# Patient Record
Sex: Female | Born: 1970 | Race: White | Hispanic: No | Marital: Single | Smoking: Current every day smoker
Health system: Southern US, Community
[De-identification: ages and names within clinical notes are randomized; demographics above are authoritative.]

## PROBLEM LIST (undated history)

## (undated) DIAGNOSIS — Z8489 Family history of other specified conditions: Secondary | ICD-10-CM

## (undated) DIAGNOSIS — F32A Depression, unspecified: Secondary | ICD-10-CM

## (undated) DIAGNOSIS — F329 Major depressive disorder, single episode, unspecified: Secondary | ICD-10-CM

## (undated) DIAGNOSIS — F988 Other specified behavioral and emotional disorders with onset usually occurring in childhood and adolescence: Secondary | ICD-10-CM

## (undated) DIAGNOSIS — R519 Headache, unspecified: Secondary | ICD-10-CM

## (undated) DIAGNOSIS — M1990 Unspecified osteoarthritis, unspecified site: Secondary | ICD-10-CM

## (undated) DIAGNOSIS — G5602 Carpal tunnel syndrome, left upper limb: Secondary | ICD-10-CM

## (undated) DIAGNOSIS — R51 Headache: Secondary | ICD-10-CM

## (undated) DIAGNOSIS — F419 Anxiety disorder, unspecified: Secondary | ICD-10-CM

## (undated) DIAGNOSIS — K5900 Constipation, unspecified: Secondary | ICD-10-CM

## (undated) DIAGNOSIS — G8929 Other chronic pain: Secondary | ICD-10-CM

## (undated) HISTORY — PX: ANKLE SURGERY: SHX546

## (undated) HISTORY — PX: TUBAL LIGATION: SHX77

## (undated) HISTORY — PX: ABDOMINAL HYSTERECTOMY: SHX81

---

## 2000-08-25 ENCOUNTER — Other Ambulatory Visit: Admission: RE | Admit: 2000-08-25 | Discharge: 2000-08-25 | Payer: Self-pay | Admitting: Obstetrics and Gynecology

## 2001-08-30 ENCOUNTER — Other Ambulatory Visit: Admission: RE | Admit: 2001-08-30 | Discharge: 2001-08-30 | Payer: Self-pay | Admitting: Obstetrics and Gynecology

## 2002-05-04 ENCOUNTER — Other Ambulatory Visit: Admission: RE | Admit: 2002-05-04 | Discharge: 2002-05-04 | Payer: Self-pay | Admitting: Obstetrics and Gynecology

## 2008-03-28 ENCOUNTER — Encounter (INDEPENDENT_AMBULATORY_CARE_PROVIDER_SITE_OTHER): Payer: Self-pay | Admitting: Obstetrics and Gynecology

## 2008-03-28 ENCOUNTER — Ambulatory Visit (HOSPITAL_COMMUNITY): Admission: RE | Admit: 2008-03-28 | Discharge: 2008-03-28 | Payer: Self-pay | Admitting: Obstetrics and Gynecology

## 2009-01-02 ENCOUNTER — Inpatient Hospital Stay (HOSPITAL_COMMUNITY): Admission: RE | Admit: 2009-01-02 | Discharge: 2009-01-03 | Payer: Self-pay | Admitting: Obstetrics and Gynecology

## 2009-01-02 ENCOUNTER — Encounter (INDEPENDENT_AMBULATORY_CARE_PROVIDER_SITE_OTHER): Payer: Self-pay | Admitting: Obstetrics and Gynecology

## 2011-04-06 LAB — COMPREHENSIVE METABOLIC PANEL
ALT: 21 U/L (ref 0–35)
AST: 43 U/L — ABNORMAL HIGH (ref 0–37)
Albumin: 2.9 g/dL — ABNORMAL LOW (ref 3.5–5.2)
Calcium: 8.1 mg/dL — ABNORMAL LOW (ref 8.4–10.5)
Chloride: 107 mEq/L (ref 96–112)
Creatinine, Ser: 0.5 mg/dL (ref 0.4–1.2)
GFR calc Af Amer: >60 mL/min (ref >60)
Sodium: 141 mEq/L (ref 135–145)

## 2011-04-06 LAB — CBC
Hemoglobin: 14.7 g/dL (ref 12.0–15.0)
MCHC: 33.6 g/dL (ref 30.0–36.0)
MCHC: 34.1 g/dL (ref 30.0–36.0)
MCV: 104.1 fL — ABNORMAL HIGH (ref 78.0–100.0)
Platelets: 161 10*3/uL (ref 150–400)
Platelets: 203 10*3/uL (ref 150–400)
RBC: 3.32 MIL/uL — ABNORMAL LOW (ref 3.87–5.11)
RDW: 13.8 % (ref 11.5–15.5)
WBC: 7.9 10*3/uL (ref 4.0–10.5)

## 2011-05-05 NOTE — Op Note (Signed)
[name], Robinson               ACCOUNT NO.:  [id number]   MEDICAL RECORD NO.:  [id number]          PATIENT TYPE:  INP   LOCATION:  9302                          FACILITY:  [facility]   PHYSICIAN:  Carrington Clamp, M.D. DATE OF BIRTH:  10-04-1971   DATE OF PROCEDURE:  DATE OF DISCHARGE:                               OPERATIVE REPORT   PREOPERATIVE DIAGNOSES:  1. Severe menorrhagia, not responsive to medical therapy.  2. Previous attempt at NovaSure ablation that was failed secondary to      uterine perforation.   POSTOPERATIVE DIAGNOSES:  1. Severe menorrhagia, not responsive to medical therapy.  2. Previous attempt at NovaSure ablation that was failed secondary to      uterine perforation.   PROCEDURE:  Laparoscopic-assisted vaginal hysterectomy.   SURGEON:  Carrington Clamp, MD   ASSISTANT:  Randye Lobo, MD   ANESTHESIA:  General.   FINDINGS:  About a 6-week size uterus without any abnormalities.  There  was no endometriosis seen.  Bilateral ovaries were normal.  The ureters  were seen coursing out of the field of dissection before, during, and  after the operation.  The appendix was not identified, but the liver  edge and the gallbladder appeared normal.   SPECIMENS:  Uterus and cervix and Filshie clips.   DISPOSITION:  To pathology.   ESTIMATED BLOOD LOSS:  300 mL.   IV FLUIDS:  2600 mL.   URINE OUTPUT:  Not measured.   COMPLICATIONS:  None.  The bladder had been filled several times with  water and sterile milk in order to ensure that the bladder was intact.   COUNTS:  Correct x3.   TECHNIQUE:  After adequate general anesthesia was achieved, the patient  was prepped and draped in the sterile fashion in dorsal lithotomy  position.  Both arms were tucked, and the hands were made sure that they  were clear of any obstacles.  The bladder was emptied with a red rubber  catheter during the surgery, and a speculum had been placed in the  vagina to allow an uterine  manipulator be passed through into the  cervix.  The speculum was removed, and attention was then turned to the  abdomen.  A 2-cm infraumbilical incision was made with the scalpel.  The  OptiVu trocar was placed, and under direct visualization entered into  the peritoneum.  The abdomen was insufflated, and the anterior  peritoneal wall was clear of any adhesions from prior tubal ligation.  The ureters were identified coursing bilaterally and normal position out  of the projected field of dissection.  The above findings were otherwise  noted, and because the patient wished to retain her ovaries they were  left in situ.   Two 5-mm trocars were placed under direct visualization of the camera  after the abdomen had been insufflated.  The EnSeal instrument was  placed in the abdomen, and a grasper used to grasp the cornea of the  uterus.  The EnSeal instrument was used to seal and ligate the right  round ligament.  The right fallopian tube was also sealed and  ligated.  Dissection of the intervening mesosalpinx was then undertaken.  The  utero-ovarian ligament and artery were then sealed as well and the  Filshie clip had been located on the uterus side of the dissection, but  at this point had fallen off into the pelvis.  The EnSeal instrument  used to take down with several bites the broad ligament until the  descending branch of the uterine artery was identified.  A pair of  scissors was used at this point in time to carefully dissect the  peritoneum off the posterior uterosacral and then carefully dissect the  peritoneum and incise the peritoneum of the vesicouterine reflection.  The same exact procedure was performed on the left-hand side with the  EnSeal instrument.  The ascending branches of the uterine arteries were  able to be sealed with the EnSeal instrument and then ligated.  Dissection of the bladder was begun with the scissors.  It was difficult  to obtain the correct plane, but  careful dissection was undertaken and  stayed deliberately deep towards the cervix.  However, we were unable to  actually secure the bladder reflection all the way off the cervix and  therefore that part of the procedure was stopped.  The bladder was then  filled with about 200 mL of sterile milk and no leakage was noted intra-  abdominally.  Attention was then turned to the vagina where all but 60  mL of sterile milk had been emptied from the bladder and the catheter  removed.  The uterine manipulator was removed off the cervix and  replaced with 2 Lahey clamps.  The cervix was injected with 1% lidocaine  with epi in a circumferential manner.  The scalpel was then used to make  a circumferential incision around the cervix with the reflection of the  vagina onto the cervix.  The posterior cul-de-sac was entered into with  the Mayo scissors, and the duckbill retractor placed.  The dissection  anteriorly was carefully undertaken just a short way up the cervix  initially.  No milk was still on the field during this procedure.  Heaney clamps were then used to clamp the bilateral uterosacrals.  Each  pedicle was incised with the Mayo scissors and secured with a Heaney  stitch of 0 Vicryl.   Continued dissection of the anterior cervix at the vesicouterine fascia  removing the bladder off the cervix until the anterior peritoneum was  entered into was effected.  The cardinal ligament was then secured with  alternating successive bites of the Heaney clamp.  Each pedicle was  incised with the Mayo scissors and secured with a stitch of 0 Vicryl.  Pedicles were identified as being hemostatic.  At this point, there was  a small little amount of bleeding from the vaginal cuff, but that was  going to be taken care of with the cuff stitches.  The posterior cuff  did have some cautery applied to it in order to reduce the flow of  blood.   The anterior peritoneum was identified and the secured with a 2-0  Vicryl  stitch.  This stitch was then placed in a pursestring suture in the  following manner:  Incorporated the right uterosacral through and  through the posterior cul-de-sac into the vagina and back into the  posterior cul-de-sac, modified Halban culdoplasty through and through  the posterior cul-de-sac into the vagina and back through into the  posterior cul-de-sac between the uterosacrals, the left uterosacral and  finally incorporated  the anterior peritoneum.  Once this stitch was tied  down, the peritoneum was closed, and the cuff was then closed with  multiple figure-of-eight stitches of 0 Vicryl.  Hemostasis had been  achieved, and attention was then turned to the abdomen.   Once gloves were changed, the abdomen was insufflated, and the scope  passed into the abdominal cavity again.  A small amount of bleeding was  seen on the vaginal cuff inside the uterosacrals and this was taken care  of with the Kleppinger instrument by bipolar cautery.  The Filshie clip  was successfully removed through one of the 5-mm ports.  All pedicles  were seen to be hemostatic and all instruments were then withdrawn from  the abdomen.  The abdomen was desufflated, and all the trocars removed.  At the 10-mm trocar site, fascia was closed with a figure-of-eight  stitch of 2-0 Vicryl.  At the small 5-mm trocar, skin incisions were  closed with through-and-through stitch of 3-0 Vicryl Rapide.  All  incisions were closed with Dermabond.   Once we went back from the vagina back to ensure hemostasis, the bladder  had been filled again with over 200 mL of milk and saline, and careful  inspection intra-abdominally revealed that the bladder was completely  intact.      Carrington Clamp, M.D.  Electronically Signed     MH/MEDQ  D:  01/02/2009  T:  01/03/2009  Job:  [id number]

## 2011-05-05 NOTE — Op Note (Signed)
[name], Robinson               ACCOUNT NO.:  [id number]   MEDICAL RECORD NO.:  [id number]          PATIENT TYPE:  AMB   LOCATION:  SDC                           FACILITY:  [facility]   PHYSICIAN:  Carrington Clamp, M.D. DATE OF BIRTH:  02-07-1971   DATE OF PROCEDURE:  03/28/2008  DATE OF DISCHARGE:                               OPERATIVE REPORT   PREOPERATIVE DIAGNOSES:  1. Menorrhagia, not responsive to medical management.  2. Desires sterilization.   POSTOPERATIVE DIAGNOSES:  1. Menorrhagia, not responsive to medical management.  2. Desires sterilization.  3. Uterine polyp.   PROCEDURE:  1. Diagnostic laparoscopy.  2. Filshie clip bilateral tubal ligation.  3. Dilation and curettage.  4. Hysteroscopy.   SURGEON:  Carrington Clamp, M.D..   ASSISTANTS:  None.   ANESTHESIA:  General.   FINDINGS:  Normal-size uterus, tubes and ovaries.  There were no  adhesions or endometriosis or any other abnormalities of the pelvis  seen.  There is a 2-cm polyp in the endometrial cavity; this was  removed.  There have been bilateral fundal punctures with the NovaSure  instrument that ceased bleeding with cautery.   SPECIMENS:  To pathology.   ESTIMATED BLOOD LOSS:  100 mL.   IV FLUIDS:   URINE OUTPUT:  Not measured.   COMPLICATIONS:  Bilateral fundal punctures with the NovaSure instrument.  The NovaSure procedure was aborted.  The right-hand side fundal puncture  was cauterized with a Kleppinger to achieve hemostasis.  The left-hand  side puncture had minimal oozing and was left alone.   MEDICATIONS:  None.   COUNTS:  Correct x3.   TECHNIQUE:  After adequate general anesthesia was achieved, the patient  was prepped and draped in the usual sterile fashion in the dorsal  lithotomy position.  A uterine manipulator was placed in the uterus and  the bladder drained with a red rubber catheter during the procedure.  A  2-cm infraumbilical incision was made with a scalpel and the  Veress  needle passed in without aspiration of the bowel contents or blood.  The  abdomen was insufflated and then the 10-mm trocar placed without  complication.  The Filshie clip instruments were introduced into the  abdomen after the above findings were noted.  A pair of Filshie clips  were placed on each on the isthmic portion of the right and left tube.  It was confirmed of the Filshie clips were around the entirety of the  tubes by looking at both sides of the mesosalpinx.  The instruments were  then withdrawn from the abdomen and the abdomen desufflated.  Attention  was then turned to the vagina, in which the uterine manipulator was  removed as well as the catheter.  The cervix was grasped with a single-  tooth tenaculum.  The cervix was then dilated with [facility] dilators.  The  hysteroscope was passed into the uterine cavity and a 2-cm polyp was  located and removed with the polyp forceps.  The curettage was then  performed with a sharp curette and the hysteroscope passed into the  uterine cavity and indicated that  a good curettage had been performed.   The uterine cavity had been measured at 8 and a cervical length of 3.5,  giving a cavity length a 4.5.  The NovaSure instrument was set for this.  The instrument was checked just prior to putting in the body and there  seemed to be no problems.  However, the NovaSure instrument was then  introduced into the endometrial cavity.  The instrument was pulled back  just a little bit to allow deployment and then pushed forward gently to  seat it.  At this point, the cavity width was almost 5 cm.  Two attempts  were made at the CO2 insufflation and both failed.  At this point, the  NovaSure instrument was removed and the hysteroscope passed through and  a clear puncture on the right-hand side was seen with the scope.  The  hysteroscopy deficit was up to 200.  The NovaSure was then abandoned and  all instruments were withdrawn from the vagina,  except for a sponge  stick.  We changed gloves and returned to the abdomen, where the scope  was passed again into the abdominal cavity.  The abdominal cavity was  insufflated.  The left-hand puncture was smaller than the right-hand  puncture.  The left-hand puncture was clearly stable and without  bleeding.  The right-hand puncture was bleeding a little bit and  hemostasis was achieved with a Kleppinger.  The bowel, cul-de-sac and  ovaries otherwise appeared normal.  Irrigation was performed and the  puncture was reinspected and found to be hemostatic.  All instruments  were then withdrawn from the abdominal cavity, the abdomen desufflated.  The 2-cm infraumbilical fascial incision was closed with figure-of-eight  stitch of 2-0 Vicryl.  The skin incision was closed with Dermabond.  The  sponge stick was removed from the vagina.  The patient tolerated the  procedure well and was returned to recovery room in stable condition.  I  am about to go discuss with the patient's family the complication and I  will discuss it with the patient as well.      Carrington Clamp, M.D.  Electronically Signed     MH/MEDQ  D:  03/28/2008  T:  03/28/2008  Job:  [id number]

## 2011-09-15 LAB — URINALYSIS, ROUTINE W REFLEX MICROSCOPIC
Bilirubin Urine: NEGATIVE
Glucose, UA: NEGATIVE
Hgb urine dipstick: NEGATIVE
Ketones, ur: NEGATIVE
Protein, ur: NEGATIVE

## 2011-09-15 LAB — CBC
HCT: 43
Hemoglobin: 14.9
MCHC: 34.6
Platelets: 203
RDW: 14

## 2013-10-03 ENCOUNTER — Emergency Department (HOSPITAL_COMMUNITY)
Admission: EM | Admit: 2013-10-03 | Discharge: 2013-10-03 | Disposition: A | Discharge status: Home / Self Care | Payer: BC Managed Care – PPO | Attending: Emergency Medicine | Admitting: Emergency Medicine

## 2013-10-03 ENCOUNTER — Encounter (HOSPITAL_COMMUNITY): Payer: Self-pay | Admitting: Emergency Medicine

## 2013-10-03 ENCOUNTER — Emergency Department (HOSPITAL_COMMUNITY): Payer: BC Managed Care – PPO

## 2013-10-03 DIAGNOSIS — S82852A Displaced trimalleolar fracture of left lower leg, initial encounter for closed fracture: Secondary | ICD-10-CM

## 2013-10-03 DIAGNOSIS — S82853A Displaced trimalleolar fracture of unspecified lower leg, initial encounter for closed fracture: Secondary | ICD-10-CM | POA: Insufficient documentation

## 2013-10-03 DIAGNOSIS — S9306XA Dislocation of unspecified ankle joint, initial encounter: Secondary | ICD-10-CM | POA: Insufficient documentation

## 2013-10-03 DIAGNOSIS — F17200 Nicotine dependence, unspecified, uncomplicated: Secondary | ICD-10-CM | POA: Insufficient documentation

## 2013-10-03 DIAGNOSIS — Z79899 Other long term (current) drug therapy: Secondary | ICD-10-CM | POA: Insufficient documentation

## 2013-10-03 DIAGNOSIS — X500XXA Overexertion from strenuous movement or load, initial encounter: Secondary | ICD-10-CM | POA: Insufficient documentation

## 2013-10-03 DIAGNOSIS — W108XXA Fall (on) (from) other stairs and steps, initial encounter: Secondary | ICD-10-CM | POA: Insufficient documentation

## 2013-10-03 DIAGNOSIS — Y929 Unspecified place or not applicable: Secondary | ICD-10-CM | POA: Insufficient documentation

## 2013-10-03 DIAGNOSIS — Z8739 Personal history of other diseases of the musculoskeletal system and connective tissue: Secondary | ICD-10-CM | POA: Insufficient documentation

## 2013-10-03 DIAGNOSIS — Y9301 Activity, walking, marching and hiking: Secondary | ICD-10-CM | POA: Insufficient documentation

## 2013-10-03 DIAGNOSIS — F1010 Alcohol abuse, uncomplicated: Secondary | ICD-10-CM | POA: Insufficient documentation

## 2013-10-03 DIAGNOSIS — R269 Unspecified abnormalities of gait and mobility: Secondary | ICD-10-CM | POA: Insufficient documentation

## 2013-10-03 DIAGNOSIS — S9305XA Dislocation of left ankle joint, initial encounter: Secondary | ICD-10-CM

## 2013-10-03 LAB — CBC WITH DIFFERENTIAL/PLATELET
Basophils Absolute: 0 10*3/uL (ref 0.0–0.1)
Eosinophils Absolute: 0 10*3/uL (ref 0.0–0.7)
Eosinophils Relative: 0 % (ref 0–5)
HCT: 41.8 % (ref 36.0–46.0)
Lymphocytes Relative: 34 % (ref 12–46)
MCH: 35.8 pg — ABNORMAL HIGH (ref 26.0–34.0)
MCHC: 34.9 g/dL (ref 30.0–36.0)
MCV: 102.5 fL — ABNORMAL HIGH (ref 78.0–100.0)
Monocytes Absolute: 0.3 10*3/uL (ref 0.1–1.0)
RDW: 13.7 % (ref 11.5–15.5)
WBC: 5.1 10*3/uL (ref 4.0–10.5)

## 2013-10-03 LAB — BASIC METABOLIC PANEL
CO2: 23 mEq/L (ref 19–32)
Calcium: 8 mg/dL — ABNORMAL LOW (ref 8.4–10.5)
Creatinine, Ser: 0.48 mg/dL — ABNORMAL LOW (ref 0.50–1.10)

## 2013-10-03 MED ORDER — HYDROMORPHONE HCL PF 1 MG/ML IJ SOLN
1.0000 mg | Freq: Once | INTRAMUSCULAR | Status: AC
  Administered 2013-10-03: 1 mg via INTRAVENOUS
  Filled 2013-10-03: qty 1

## 2013-10-03 MED ORDER — PROPOFOL 10 MG/ML IV BOLUS
100.0000 mg | Freq: Once | INTRAVENOUS | Status: AC
  Administered 2013-10-03: 100 mg via INTRAVENOUS
  Filled 2013-10-03: qty 20

## 2013-10-03 MED ORDER — NICOTINE 21 MG/24HR TD PT24
21.0000 mg | MEDICATED_PATCH | Freq: Every day | TRANSDERMAL | Status: DC
  Administered 2013-10-03: 21 mg via TRANSDERMAL
  Filled 2013-10-03: qty 1

## 2013-10-03 MED ORDER — ONDANSETRON HCL 4 MG PO TABS: 4.0000 mg | ORAL_TABLET | Freq: Four times a day (QID) | ORAL | Status: DC

## 2013-10-03 MED ORDER — SODIUM CHLORIDE 0.9 % IV BOLUS (SEPSIS)
1000.0000 mL | Freq: Once | INTRAVENOUS | Status: AC
  Administered 2013-10-03: 1000 mL via INTRAVENOUS

## 2013-10-03 MED ORDER — OXYCODONE-ACETAMINOPHEN 5-325 MG PO TABS: 2.0000 | ORAL_TABLET | Freq: Four times a day (QID) | ORAL | Status: DC | PRN

## 2013-10-03 MED ORDER — ONDANSETRON HCL 4 MG/2ML IJ SOLN
4.0000 mg | Freq: Once | INTRAMUSCULAR | Status: AC
  Administered 2013-10-03: 4 mg via INTRAVENOUS
  Filled 2013-10-03: qty 2

## 2013-10-03 NOTE — ED Notes (Signed)
Family at bedside. 

## 2013-10-03 NOTE — ED Notes (Signed)
[facility] from home 1550. Obvious deformity to Left ankle. PT sitting on porch steps, started to stand up, mat that PT was standing on slipped out from under her feet. Unknown if PT fell to head. Hx of prior surgery to Left foot.  EMS Fentanyl given.

## 2013-10-03 NOTE — Progress Notes (Signed)
Orthopedic Tech Progress Note Patient Details:  Tracie Robinson Mar 29, 1971 [id number]  Ortho Devices Type of Ortho Device: Ace wrap;Post (short leg) splint;Stirrup splint Ortho Device/Splint Location: LLE Ortho Device/Splint Interventions: Ordered;Application   Jennye Moccasin 10/03/2013, 7:38 PM

## 2013-10-03 NOTE — ED Provider Notes (Signed)
CSN: [id number]     Arrival date & time 10/03/13  1657 History   First MD Initiated Contact with Patient 10/03/13 1708     Chief Complaint  Patient presents with  . Ankle Injury   (Consider location/radiation/quality/duration/timing/severity/associated sxs/prior Treatment) HPI Comments: Patient is a 42 year old female presents today after falling on a wet wire mat. She injured her left ankle. She does not remember exactly what happened because "it all happened so fast". She has no other new pain from the fall. There is no obvious deformity noted. She was brought in by EMS and given fentanyl which did not relieve her pain. She reports that the pain is a 1000 out of 10. She cannot give a quality of the pain other than it is constant. She struggles with chronic pain issues and is being treated for sciatica in her back. She admits to drinking 4 beers and taking one Xanax today. She has prior fracture to her left ankle approximately 2 years ago. No surgery or hardware in ankle. She believes her tetanus is UTD.   The history is provided by the patient. No language interpreter was used.    History reviewed. No pertinent past medical history. Past Surgical History  Procedure Laterality Date  . Ankle surgery Left    History reviewed. No pertinent family history. History  Substance Use Topics  . Smoking status: Current Every Day Smoker  . Smokeless tobacco: Not on file  . Alcohol Use: Yes   OB History   Grav Para Term Preterm Abortions TAB SAB Ect Mult Living                 Review of Systems  Constitutional: Negative for fever and chills.  Respiratory: Negative for shortness of breath.   Cardiovascular: Negative for chest pain.  Gastrointestinal: Negative for nausea, vomiting and abdominal pain.  Musculoskeletal: Positive for arthralgias, gait problem, joint swelling and myalgias.  All other systems reviewed and are negative.    Allergies  Review of patient's allergies indicates no  known allergies.  Home Medications   Current Outpatient Rx  Name  Route  Sig  Dispense  Refill  . ALPRAZolam (XANAX) 1 MG tablet   Oral   Take 1 mg by mouth 4 (four) times daily as needed for sleep.         . Aspirin-Acetaminophen (GOODYS BODY PAIN PO)   Oral   Take 1 Package by mouth every 6 (six) hours as needed (back pain).         Marland Kitchen FLUoxetine (PROZAC) 40 MG capsule   Oral   Take 40 mg by mouth daily.         . mirtazapine (REMERON) 30 MG tablet   Oral   Take 30 mg by mouth at bedtime as needed.         . ondansetron (ZOFRAN) 4 MG tablet   Oral   Take 1 tablet (4 mg total) by mouth every 6 (six) hours.   12 tablet   0   . oxyCODONE-acetaminophen (PERCOCET/ROXICET) 5-325 MG per tablet   Oral   Take 2 tablets by mouth every 6 (six) hours as needed for pain.   20 tablet   0    BP 114/83  Pulse 86  Temp(Src) 98.2 F (36.8 C)  Resp 16  Ht 5\' 4"  (1.626 m)  Wt 155 lb (70.308 kg)  BMI 26.59 kg/m2  SpO2 90% Physical Exam  Nursing note and vitals reviewed. Constitutional: She is oriented to person,  place, and time. She appears well-developed and well-nourished. No distress.  HENT:  Head: Normocephalic and atraumatic.  Right Ear: External ear normal.  Left Ear: External ear normal.  Nose: Nose normal.  Mouth/Throat: Oropharynx is clear and moist.  Eyes: Conjunctivae are normal.  Neck: Normal range of motion.  Cardiovascular: Normal rate, regular rhythm and normal heart sounds.   Pulmonary/Chest: Effort normal and breath sounds normal. No stridor. No respiratory distress. She has no wheezes. She has no rales.  Abdominal: Soft. She exhibits no distension.  Musculoskeletal:       Left ankle: She exhibits decreased range of motion, swelling and deformity. She exhibits normal pulse. Tenderness.  Obvious deformity of left ankle. Skin tenting medially. Neurovascularly intact.  Distal pulse intact. Cap refill <3 seconds in all toes. Sensation intact in all toes.    Neurological: She is alert and oriented to person, place, and time. She has normal strength.  Skin: Skin is warm and dry. She is not diaphoretic. No erythema.  Psychiatric: She has a normal mood and affect. Her behavior is normal.    ED Course  Reduction of dislocation Performed by: Mora Bellman Authorized by: Mora Bellman Consent: Verbal consent obtained. written consent not obtained. The procedure was performed in an emergent situation. Risks and benefits: risks, benefits and alternatives were discussed Consent given by: patient Patient understanding: patient states understanding of the procedure being performed Patient consent: the patient's understanding of the procedure matches consent given Required items: required blood products, implants, devices, and special equipment available Patient identity confirmed: verbally with patient and arm band Time out: Immediately prior to procedure a "time out" was called to verify the correct patient, procedure, equipment, support staff and site/side marked as required. Patient sedated: yes Sedatives: propofol Analgesia: see MAR for details Patient tolerance: Patient tolerated the procedure well with no immediate complications.  SPLINT APPLICATION Performed by: Mora Bellman Authorized by: Mora Bellman Consent: Verbal consent obtained. written consent not obtained. The procedure was performed in an emergent situation. Risks and benefits: risks, benefits and alternatives were discussed Consent given by: patient Patient understanding: patient states understanding of the procedure being performed Patient consent: the patient's understanding of the procedure matches consent given Required items: required blood products, implants, devices, and special equipment available Patient identity confirmed: verbally with patient and arm band Time out: Immediately prior to procedure a "time out" was called to verify the correct patient,  procedure, equipment, support staff and site/side marked as required. Location details: left ankle Splint type: short leg Supplies used: cotton padding, Ortho-Glass and elastic bandage Post-procedure: The splinted body part was neurovascularly unchanged following the procedure. Patient tolerance: Patient tolerated the procedure well with no immediate complications.   (including critical care time)    Labs Review Labs Reviewed  CBC WITH DIFFERENTIAL - Abnormal; Notable for the following:    MCV 102.5 (*)    MCH 35.8 (*)    All other components within normal limits  BASIC METABOLIC PANEL - Abnormal; Notable for the following:    BUN 4 (*)    Creatinine, Ser 0.48 (*)    Calcium 8.0 (*)    All other components within normal limits   Imaging Review Dg Ankle 2 Views Left  10/03/2013   CLINICAL DATA:  Status post fall. Left ankle pain.  EXAM: LEFT ANKLE - 2 VIEW  COMPARISON:  None.  FINDINGS: The patient has an oblique fracture of the distal fibula with 1 shaft with lateral displacement.  The talus is laterally subluxed and posteriorly subluxed. Medial malleolar fracture is identified. There also appears to be a posterior malleolar fracture.  IMPRESSION: Trimalleolar fractures and lateral and posterior subluxation of the talus out of the tibiotalar joint.   Electronically Signed   By: Drusilla Kanner M.D.   On: 10/03/2013 18:32   Dg Ankle Left Port  10/03/2013   CLINICAL DATA:  Postreduction  EXAM: PORTABLE LEFT ANKLE - 2 VIEW  COMPARISON:  10/03/2013  FINDINGS: Status post reduction of a severely displaced trimalleolar fracture. Cast obscures bone detail. Allowing for this, there is near anatomic position and alignment of the fracture fragments with symmetric mortise, all with the exception of a 5 mm posterior displacement of distal fracture fragment at the oblique fracture site in the distal shaft of the fibula.  IMPRESSION: Significantly improved positioning and alignment status post  reduction.   Electronically Signed   By: Esperanza Heir M.D.   On: 10/03/2013 19:21    EKG Interpretation   None      7:54 PM Discussed case with Dr. Charlann Boxer. He recommends non weight bearing and aggressive ice and elevation. She needs to call the office and see Dr. Victorino Dike this week for probable surgery next week.   MDM   1. Trimalleolar fracture of ankle, closed, left, initial encounter   2. Ankle dislocation, left, initial encounter    Patient presents with trimalleloar fractures and lateral and posterior subluxation of talus out of tibiotalar joint. Successful reduction of ankle. Splinted in short leg splint with stirrup. Neurovascularly intact. Discussed this case with Dr. Charlann Boxer who is on call for orthopedics. He recommends non weight bearing, aggressive ice and elevation, and follow up with Dr. Victorino Dike. I discussed this with patient and her family who expresses understanding. She will call in the am for an appointment this week with likely surgery next week. I also discussed reasons to return to the emergency department sooner. Dr. Jodi Mourning evaluated patient and assisted with reduction. He agrees with plan. Vital signs stable for discharge. Patient / Family / Caregiver informed of clinical course, understand medical decision-making process, and agree with plan.     Mora Bellman, PA-C 10/04/13 [phone]  Medical screening examination/treatment/procedure(s) were conducted as a shared visit with non-physician practitioner(s) or resident and myself. I personally evaluated the patient during the encounter and agree with the findings and plan unless otherwise indicated.  Twisted ankle on porch steps, mild fell. Pt denies other injuries. Left ankle dislocated/ deformed, pain with rom and palpation, decr rom due to deformity, nv intact distal. Pain meds. Xray and procedural sedation to reduce.  Neck supple, neuro intact.      Xray showed trimalleolar unstable fracture.  Mild skin tenting on exam  medially, close fx.   Discussed r/b of sedation, pt consented.  Procedural sedation Performed by: Enid Skeens Consent: Verbal consent obtained. Risks and benefits: risks, benefits and alternatives were discussed Required items: required blood products, implants, devices, and special equipment available Patient identity confirmed: arm band and provided demographic data Time out: Immediately prior to procedure a "time out" was called to verify the correct patient, procedure, equipment, support staff and site  Sedation type: moderate (conscious) sedation NPO time confirmed, risks discussed  Sedatives: propofol 80 mg  Physician Time at Bedside: 20 min  Vitals: Vital signs were monitored during sedation. Cardiac Monitor, pulse oximeter Patient tolerance: Patient tolerated the procedure well with no immediate complications. Comments: Pt with uneventful recovered. Returned to pre-procedural sedation baseline  I personally assisted with ankle relocation/ reduction and splinting with ortho tech.  NV intact after.  Ortho called to arrange fup to discuss surgery outpt.  Pt pain controlled in ED.   Ankle dislocation, Trimall fracture  Enid Skeens, MD 10/04/13 0130

## 2013-10-25 NOTE — ED Provider Notes (Signed)
CSN: [id number]     Arrival date & time 10/03/13  1657 History   First MD Initiated Contact with Patient 10/03/13 1708     Chief Complaint  Patient presents with  . Ankle Injury   (Consider location/radiation/quality/duration/timing/severity/associated sxs/prior Treatment) HPI HPI Comments: Patient is a 42 year old female presents today after falling on a wet wire mat. She injured her left ankle. She does not remember exactly what happened because "it all happened so fast". She has no other new pain from the fall. There is no obvious deformity noted. She was brought in by EMS and given fentanyl which did not relieve her pain. She reports that the pain is a 1000 out of 10. She cannot give a quality of the pain other than it is constant. She struggles with chronic pain issues and is being treated for sciatica in her back. She admits to drinking 4 beers and taking one Xanax today. She has prior fracture to her left ankle approximately 2 years ago. No surgery or hardware in ankle. She believes her tetanus is UTD.   History reviewed. No pertinent past medical history. Past Surgical History  Procedure Laterality Date  . Ankle surgery Left    History reviewed. No pertinent family history. History  Substance Use Topics  . Smoking status: Current Every Day Smoker  . Smokeless tobacco: Not on file  . Alcohol Use: Yes   OB History   Grav Para Term Preterm Abortions TAB SAB Ect Mult Living                 Review of Systems Constitutional: Negative for fever and chills.  Respiratory: Negative for shortness of breath.  Cardiovascular: Negative for chest pain.  Gastrointestinal: Negative for nausea, vomiting and abdominal pain.  Musculoskeletal: Positive for arthralgias, gait problem, joint swelling and myalgias.  All other systems reviewed and are negative.  Allergies  Review of patient's allergies indicates no known allergies.  Home Medications   Current Outpatient Rx  Name  Route  Sig   Dispense  Refill  . ALPRAZolam (XANAX) 1 MG tablet   Oral   Take 1 mg by mouth 4 (four) times daily as needed for sleep.         . Aspirin-Acetaminophen (GOODYS BODY PAIN PO)   Oral   Take 1 Package by mouth every 6 (six) hours as needed (back pain).         Marland Kitchen FLUoxetine (PROZAC) 40 MG capsule   Oral   Take 40 mg by mouth daily.         . mirtazapine (REMERON) 30 MG tablet   Oral   Take 30 mg by mouth at bedtime as needed.         . ondansetron (ZOFRAN) 4 MG tablet   Oral   Take 1 tablet (4 mg total) by mouth every 6 (six) hours.   12 tablet   0   . oxyCODONE-acetaminophen (PERCOCET/ROXICET) 5-325 MG per tablet   Oral   Take 2 tablets by mouth every 6 (six) hours as needed for pain.   20 tablet   0    BP 114/83  Pulse 86  Temp(Src) 98.2 F (36.8 C)  Resp 16  Ht 5\' 4"  (1.626 m)  Wt 155 lb (70.308 kg)  BMI 26.59 kg/m2  SpO2 90% Physical Exam Nursing note and vitals reviewed.  Constitutional: She is oriented to person, place, and time. She appears well-developed and well-nourished. No distress.  HENT:  Head: Normocephalic and atraumatic.  Right Ear: External ear normal.  Left Ear: External ear normal.  Nose: Nose normal.  Mouth/Throat: Oropharynx is clear and moist.  Eyes: Conjunctivae are normal.  Neck: Normal range of motion.  Cardiovascular: Normal rate, regular rhythm and normal heart sounds.  Pulmonary/Chest: Effort normal and breath sounds normal. No stridor. No respiratory distress. She has no wheezes. She has no rales.  Abdominal: Soft. She exhibits no distension.  Musculoskeletal:  Left ankle: She exhibits decreased range of motion, swelling and deformity. She exhibits normal pulse. Tenderness.  Obvious deformity of left ankle. Skin tenting medially. Neurovascularly intact. Distal pulse intact. Cap refill <3 seconds in all toes. Sensation intact in all toes.  Neurological: She is alert and oriented to person, place, and time. She has normal  strength.  Skin: Skin is warm and dry. She is not diaphoretic. No erythema.  Psychiatric: She has a normal mood and affect. Her behavior is normal.   ED Course  Procedures (including critical care time) Reduction of dislocation Performed by: Mora Bellman  Authorized by: Mora Bellman  Consent: Verbal consent obtained. written consent not obtained. The procedure was performed in an emergent situation. Risks and benefits: risks, benefits and alternatives were discussed Consent given by: patient  Patient understanding: patient states understanding of the procedure being performed  Patient consent: the patient's understanding of the procedure matches consent given  Required items: required blood products, implants, devices, and special equipment available  Patient identity confirmed: verbally with patient and arm band  Time out: Immediately prior to procedure a "time out" was called to verify the correct patient, procedure, equipment, support staff and site/side marked as required. Patient sedated: yes Sedatives: propofol Analgesia: see MAR for details Patient tolerance: Patient tolerated the procedure well with no immediate complications.  SPLINT APPLICATION Performed by: Mora Bellman  Authorized by: Mora Bellman  Consent: Verbal consent obtained. written consent not obtained. The procedure was performed in an emergent situation. Risks and benefits: risks, benefits and alternatives were discussed Consent given by: patient  Patient understanding: patient states understanding of the procedure being performed  Patient consent: the patient's understanding of the procedure matches consent given  Required items: required blood products, implants, devices, and special equipment available  Patient identity confirmed: verbally with patient and arm band  Time out: Immediately prior to procedure a "time out" was called to verify the correct patient, procedure, equipment, support  staff and site/side marked as required. Location details: left ankle Splint type: short leg Supplies used: cotton padding, Ortho-Glass and elastic bandage Post-procedure: The splinted body part was neurovascularly unchanged following the procedure. Patient tolerance: Patient tolerated the procedure well with no immediate complications.  (including critical care time)   Labs Review Labs Reviewed  CBC WITH DIFFERENTIAL - Abnormal; Notable for the following:    MCV 102.5 (*)    MCH 35.8 (*)    All other components within normal limits  BASIC METABOLIC PANEL - Abnormal; Notable for the following:    BUN 4 (*)    Creatinine, Ser 0.48 (*)    Calcium 8.0 (*)    All other components within normal limits   Imaging Review No results found.  Dg Ankle 2 Views Left  10/03/2013 CLINICAL DATA: Status post fall. Left ankle pain. EXAM: LEFT ANKLE - 2 VIEW COMPARISON: None. FINDINGS: The patient has an oblique fracture of the distal fibula with 1 shaft with lateral displacement. The talus is laterally subluxed and posteriorly subluxed. Medial malleolar fracture is identified.  There also appears to be a posterior malleolar fracture. IMPRESSION: Trimalleolar fractures and lateral and posterior subluxation of the talus out of the tibiotalar joint. Electronically Signed By: Drusilla Kanner M.D. On: 10/03/2013 18:32  Dg Ankle Left Port  10/03/2013 CLINICAL DATA: Postreduction EXAM: PORTABLE LEFT ANKLE - 2 VIEW COMPARISON: 10/03/2013 FINDINGS: Status post reduction of a severely displaced trimalleolar fracture. Cast obscures bone detail. Allowing for this, there is near anatomic position and alignment of the fracture fragments with symmetric mortise, all with the exception of a 5 mm posterior displacement of distal fracture fragment at the oblique fracture site in the distal shaft of the fibula. IMPRESSION: Significantly improved positioning and alignment status post reduction. Electronically Signed By: Esperanza Heir M.D. On: 10/03/2013 19:21   7:54 PM Discussed case with Dr. Charlann Boxer. He recommends non weight bearing and aggressive ice and elevation. She needs to call the office and see Dr. Victorino Dike this week for probable surgery next week.    EKG Interpretation   None       MDM   1. Trimalleolar fracture of ankle, closed, left, initial encounter   2. Ankle dislocation, left, initial encounter    Patient presents with trimalleloar fractures and lateral and posterior subluxation of talus out of tibiotalar joint. Successful reduction of ankle. Splinted in short leg splint with stirrup. Neurovascularly intact. Discussed this case with Dr. Charlann Boxer who is on call for orthopedics. He recommends non weight bearing, aggressive ice and elevation, and follow up with Dr. Victorino Dike. I discussed this with patient and her family who expresses understanding. She will call in the am for an appointment this week with likely surgery next week. I also discussed reasons to return to the emergency department sooner. Dr. Jodi Mourning evaluated patient and assisted with reduction. He agrees with plan. Vital signs stable for discharge. Patient / Family / Caregiver informed of clinical course, understand medical decision-making process, and agree with plan.  Mora Bellman, PA-C  10/04/13 0048      Mora Bellman, PA-C 10/25/13 1510

## 2013-10-25 NOTE — ED Provider Notes (Signed)
See previous note. I personally evaluated and assisted with reduction and procedural sedation.   Enid Skeens, MD 10/25/13 [phone]

## 2014-08-06 ENCOUNTER — Ambulatory Visit: Payer: Self-pay | Admitting: Podiatry

## 2014-08-09 ENCOUNTER — Ambulatory Visit (INDEPENDENT_AMBULATORY_CARE_PROVIDER_SITE_OTHER): Payer: BC Managed Care – PPO | Admitting: Podiatry

## 2014-08-09 ENCOUNTER — Ambulatory Visit: Payer: Self-pay | Admitting: Podiatry

## 2014-08-09 ENCOUNTER — Ambulatory Visit (INDEPENDENT_AMBULATORY_CARE_PROVIDER_SITE_OTHER): Payer: BC Managed Care – PPO

## 2014-08-09 VITALS — BP 129/91 | HR 83 | Resp 16

## 2014-08-09 DIAGNOSIS — M25579 Pain in unspecified ankle and joints of unspecified foot: Secondary | ICD-10-CM

## 2014-08-09 DIAGNOSIS — Z472 Encounter for removal of internal fixation device: Secondary | ICD-10-CM

## 2014-08-09 DIAGNOSIS — M779 Enthesopathy, unspecified: Secondary | ICD-10-CM

## 2014-08-09 DIAGNOSIS — M25572 Pain in left ankle and joints of left foot: Secondary | ICD-10-CM

## 2014-08-09 MED ORDER — TRIAMCINOLONE ACETONIDE 10 MG/ML IJ SUSP
10.0000 mg | Freq: Once | INTRAMUSCULAR | Status: AC
  Administered 2014-08-09: 10 mg

## 2014-08-09 MED ORDER — HYDROCODONE-ACETAMINOPHEN 10-325 MG PO TABS: 1.0000 | ORAL_TABLET | Freq: Three times a day (TID) | ORAL | Status: DC | PRN

## 2014-08-09 NOTE — Progress Notes (Signed)
Subjective:     Patient ID: Tracie MoriCynthia P Capley, female   DOB: 04/20/1971, 43 y.o.   MRN: [id number]  HPI patient states that she broke her left ankle around 10 months ago and she had surgery on it and it is bothering her on the outside and making it hard to walk comfortably. She did not get along well with the Dr. and the last time she saw him was in January and I had treated her prior for other issues.   Review of Systems  All other systems reviewed and are negative.      Objective:   Physical Exam  Nursing note and vitals reviewed. Constitutional: She is oriented to person, place, and time.  Cardiovascular: Intact distal pulses.   Musculoskeletal: Normal range of motion.  Neurological: She is oriented to person, place, and time.  Skin: Skin is warm.   neurovascular status found to be intact with muscle strength adequate and range of motion subtalar and midtarsal joint within normal limits. Patient is found to have a large plate on the outside of the left ankle that is irritated and especially around the fibular malleolus it is quite prominent and sore. I did not note any mottled skin formation or any coolness to the foot but it is moderately discomforting when palpated and I did note there was discomfort in the sinus tarsi with inversion eversion     Assessment:     Difficult to say as to whether or not the plate is causing all of her problems but there is no doubt that she has irritation from the screws especially around the fibular malleolus teary at no current indications of chronic pain syndrome but she does have some pain in excess to what I would expect 10 months after surgery so that we'll need to be watched and I do think she has sinus tarsi inflammation    Plan:     H&P and x-ray reviewed with Dr. Ardelle AntonWagoner who saw the patient with me. We both agreed that removal of the plate at this time is the most prudent way to go and hopefully will give her relief of all symptoms even though I  spent a great of time explained to her that there is no guarantee that this will solve all the pain she is experiencing. Patient wants to have this procedure done needs to wait several months due to her work schedule and at this time she read a consent form for removal of plate and 8 screws going over all possible complications that can occur and the fact there is no long-term guarantees as far success of this procedure. She is willing to accept this risk signs consent form was given preoperative instructions for surgery and is scheduled to have this done as soon as she is able with work. I encouraged her to call with any other questions and I did inject the sinus tarsi today to try to give her some relief and dispensed an air fracture walker to wear at home to try to reduce inflammation

## 2014-08-09 NOTE — Progress Notes (Signed)
   Subjective:    Patient ID: Tracie MoriCynthia P Colarusso, female    DOB: April 13, 1971, 43 y.o.   MRN: [id number]  HPI Comments: "I need some help with this ankle"  Patient c/o severe aching lateral left ankle since her injury October 2014. She fell and fractured and dislocated her ankle. She was put in temp cast in ER and Dr. Victorino DikeHewitt did surgery. She was in cast post op. She has never felt better since surgery. Can't go for walks and not able to wear sneakers. Her ankle swells if on it a lot. She has a noticeable knot at the lateral ankle. Needs another opinion.      Review of Systems  Constitutional: Positive for activity change and unexpected weight change.  Musculoskeletal: Positive for arthralgias and back pain.  Psychiatric/Behavioral: The patient is nervous/anxious.   All other systems reviewed and are negative.      Objective:   Physical Exam        Assessment & Plan:

## 2014-08-21 ENCOUNTER — Telehealth: Payer: Self-pay | Admitting: *Deleted

## 2014-08-21 NOTE — Telephone Encounter (Signed)
I'm a patient of Dr. Charlsie Merles.  I had a bit of a boo boo earlier.  I just have some questions.  You can reach me at this number or  [phone].

## 2014-08-22 NOTE — Telephone Encounter (Signed)
I attempted to call the patient, I left a message on her home and mobile phone.

## 2014-09-07 ENCOUNTER — Telehealth: Payer: Self-pay | Admitting: *Deleted

## 2014-09-07 NOTE — Telephone Encounter (Signed)
I'm a patient of Dr. Charlsie Merles calling concerning my ankle.  I been trying to call you guys, I called yesterday.  I returned her call.  She stated she is scheduled for surgery on 09/25/2014.  "My question is, is it okay to get another cortisone injection in my ankle this close to surgery or can I get something for pain?  My foot is killing me.  I explained to her that Dr. Charlsie Merles is not in the office. I will try to get in contact with him.  She stated well I know he had another doctor to come in there to talk to me.  Can you ask him?  I told her I would try to contact them.  She said I know I would have to come in there to pick up a prescription because it can't be called in.  Just let me know.

## 2014-09-11 NOTE — Telephone Encounter (Signed)
vicodin 10/325. Need to make sure she has signed the consent form. # [id number]

## 2014-09-13 NOTE — Telephone Encounter (Signed)
I attempted to call the patient.  I left her a message to call me back. 

## 2014-09-14 MED ORDER — HYDROCODONE-ACETAMINOPHEN 10-325 MG PO TABS: 1.0000 | ORAL_TABLET | Freq: Four times a day (QID) | ORAL | Status: DC | PRN

## 2014-09-14 NOTE — Telephone Encounter (Signed)
Calling back, I'm a patient of Dr. Charlsie Merles.  I'm scheduled to have surgery.  Someone had called me twice today.  I'm just returning the call.  I called and informed her that Dr. Charlsie Merles said you can come by to pick up a prescription.  She said her dad may pick it up for her.  I asked for his name and explained to her that he will have to show identification to pick it up as well as sign for it.  She stated, "Things have really gotten strict.  Either he or I will pick it up.  His name is Leitha Schuller.

## 2014-09-25 ENCOUNTER — Telehealth: Payer: Self-pay | Admitting: *Deleted

## 2014-09-25 ENCOUNTER — Encounter: Payer: Self-pay | Admitting: Podiatry

## 2014-09-25 DIAGNOSIS — Z4889 Encounter for other specified surgical aftercare: Secondary | ICD-10-CM

## 2014-09-25 NOTE — Telephone Encounter (Signed)
I had surgery this morning by Dr. Charlsie Merlesegal.  I took the boot off to ice it and my bandage is wet with blood.  It's not on the ace bandage, just on the gauze.  Should I be concerned?  I told her it is normal, if it gets to be excessive give us a call.  If it comes through the ace bandage and gets bigger than a 50 cent piece give us a call.  I also advised her to elevate as much as possible.  She stated, "I have been, only time I am up is to go to the bathroom."

## 2014-09-27 ENCOUNTER — Telehealth: Payer: Self-pay | Admitting: Podiatry

## 2014-09-27 NOTE — Telephone Encounter (Signed)
10.8.2015 I called the patient to reschedule her appointment time for her first post-op visit on Monday 10.12.2015. After rescheduling her appointment time the patient stated that she was in severe pain and had questions about her pain medication. She also stated that she peeled her bandage back and saw dried blood, but that it was damp in the middle where it was unable to dry. I told her that the nurses were currently with patients but as soon as I saw one I would have them call her in regards to her questions about her pain medication. JMS

## 2014-10-01 ENCOUNTER — Ambulatory Visit (INDEPENDENT_AMBULATORY_CARE_PROVIDER_SITE_OTHER): Payer: BC Managed Care – PPO

## 2014-10-01 ENCOUNTER — Ambulatory Visit (INDEPENDENT_AMBULATORY_CARE_PROVIDER_SITE_OTHER): Payer: BC Managed Care – PPO | Admitting: Podiatry

## 2014-10-01 ENCOUNTER — Encounter: Payer: Self-pay | Admitting: Podiatry

## 2014-10-01 VITALS — BP 133/88 | HR 75 | Resp 16

## 2014-10-01 DIAGNOSIS — Z9889 Other specified postprocedural states: Secondary | ICD-10-CM

## 2014-10-01 MED ORDER — OXYCODONE-ACETAMINOPHEN 10-325 MG PO TABS: 1.0000 | ORAL_TABLET | Freq: Three times a day (TID) | ORAL | Status: DC | PRN

## 2014-10-02 NOTE — Progress Notes (Signed)
Subjective:     Patient ID: Tracie MoriCynthia P Stohr, female   DOB: 1971/11/12, 43 y.o.   MRN: [id number]  HPI patient states that she is sore from the surgery but overall is doing fine after having played removed from the left ankle   Review of Systems     Objective:   Physical Exam Neurovascular status intact with well-healing surgical site left lateral ankle with negative Homans sign noted wound edges well coapted and no drainage    Assessment:     Healing well from plate removal left    Plan:     Reviewed x-rays and continued immobilization of the area with boot and nonweightbearing as best as possible. Reappoint 2 weeks for staple removal or earlier if any issues should occur

## 2014-10-15 ENCOUNTER — Other Ambulatory Visit: Payer: BC Managed Care – PPO

## 2014-10-15 ENCOUNTER — Telehealth: Payer: Self-pay | Admitting: *Deleted

## 2014-10-15 MED ORDER — HYDROCODONE-ACETAMINOPHEN 10-325 MG PO TABS: 1.0000 | ORAL_TABLET | Freq: Four times a day (QID) | ORAL | Status: DC | PRN

## 2014-10-15 NOTE — Telephone Encounter (Signed)
Pt cancelled appt due to lack of transportation, but would like a refill of the Hydrocodone.  I explained to the pt the Hydrocodone would need to be picked up in the office.  Pt is rescheduled to 10/18/2014.  Dr. Charlsie Merlesegal ordered to refill the Hydrocodone for enough to get pt in for the appt on 10/18/2014.

## 2014-10-18 ENCOUNTER — Encounter: Payer: Self-pay | Admitting: Podiatry

## 2014-10-18 ENCOUNTER — Ambulatory Visit (INDEPENDENT_AMBULATORY_CARE_PROVIDER_SITE_OTHER): Payer: BC Managed Care – PPO | Admitting: Podiatry

## 2014-10-18 VITALS — BP 129/69 | HR 88 | Resp 16

## 2014-10-18 DIAGNOSIS — Z9889 Other specified postprocedural states: Secondary | ICD-10-CM

## 2014-10-18 MED ORDER — HYDROCODONE-ACETAMINOPHEN 10-325 MG PO TABS: 1.0000 | ORAL_TABLET | Freq: Three times a day (TID) | ORAL | Status: DC | PRN

## 2014-10-22 NOTE — Progress Notes (Signed)
Subjective:     Patient ID: Tracie MoriCynthia P Cowans, female   DOB: Jun 08, 1971, 43 y.o.   MRN: [id number]  HPIpatient states I'm starting to feel better and I'm ready to get my staples out   Review of Systems     Objective:   Physical Exam Neurovascular status intact with well-healing surgical site left lateral leg with staples in place    Assessment:     Doing well post removal of hardware left    Plan:     Complete anesthetic provided to the area prior to removal of staples which then were removed with wound edges well coapted no drainage noted no erythema noted and good alignment noted. Apply dressing and did refill prescription for Percocet as she still has discomfort more towards the end of the day. Reappoint to recheck

## 2014-11-08 ENCOUNTER — Encounter: Payer: BC Managed Care – PPO | Admitting: Podiatry

## 2014-11-12 ENCOUNTER — Encounter: Payer: BC Managed Care – PPO | Admitting: Podiatry

## 2014-11-20 ENCOUNTER — Encounter: Payer: BC Managed Care – PPO | Admitting: Podiatry

## 2014-11-29 ENCOUNTER — Ambulatory Visit (INDEPENDENT_AMBULATORY_CARE_PROVIDER_SITE_OTHER): Payer: BC Managed Care – PPO | Admitting: Podiatry

## 2014-11-29 ENCOUNTER — Encounter: Payer: Self-pay | Admitting: Podiatry

## 2014-11-29 VITALS — BP 133/88 | HR 75 | Resp 16

## 2014-11-29 DIAGNOSIS — M779 Enthesopathy, unspecified: Secondary | ICD-10-CM

## 2014-11-29 DIAGNOSIS — Z9889 Other specified postprocedural states: Secondary | ICD-10-CM

## 2014-11-29 MED ORDER — TRIAMCINOLONE ACETONIDE 10 MG/ML IJ SUSP
10.0000 mg | Freq: Once | INTRAMUSCULAR | Status: AC
  Administered 2014-11-29: 10 mg

## 2014-11-29 MED ORDER — HYDROCODONE-ACETAMINOPHEN 10-325 MG PO TABS
1.0000 | ORAL_TABLET | Freq: Three times a day (TID) | ORAL | Status: DC | PRN
Start: 2014-11-29 — End: 2016-03-19

## 2014-11-29 NOTE — Progress Notes (Signed)
Subjective:     Patient ID: Tracie Robinson, female   DOB: 1971-03-23, 43 y.o.   MRN: [id number]  HPI patient states that her foot is feeling quite a bit better but that she is having pain in her ankle and she doesn't think it'll ever be normal   Review of Systems     Objective:   Physical Exam Neurovascular status is unchanged with well-healed surgical site lateral aspect of left ankle secondary to removal of plate and 8 screws and has discomfort now in the sinus tarsi left and the medial ankle    Assessment:     Doing well post removal of large fixation device left but does have probable inflammation and arthritis secondary to severe fracture in the past    Plan:     Reviewed condition and at this time I did a sub-Taylor injection 3 mg Kenalog 5 mg Xylocaine to try to reduce inflammation and did write a prescription for hydrocodone 10/325 to take on an as-needed basis. Patient will be reevaluated in 8 weeks earlier if necessary

## 2014-12-26 ENCOUNTER — Ambulatory Visit: Payer: BC Managed Care – PPO | Admitting: Podiatry

## 2015-01-30 ENCOUNTER — Encounter: Payer: BC Managed Care – PPO | Admitting: Podiatry

## 2015-02-18 ENCOUNTER — Encounter: Payer: Self-pay | Admitting: Podiatry

## 2015-02-26 ENCOUNTER — Encounter: Payer: Self-pay | Admitting: Podiatry

## 2015-02-28 ENCOUNTER — Encounter: Payer: Self-pay | Admitting: Podiatry

## 2015-03-19 ENCOUNTER — Encounter: Payer: Self-pay | Admitting: Podiatry

## 2015-03-21 ENCOUNTER — Encounter: Payer: Self-pay | Admitting: Podiatry

## 2015-03-25 ENCOUNTER — Encounter: Payer: Self-pay | Admitting: Podiatry

## 2015-03-28 ENCOUNTER — Encounter: Payer: Self-pay | Admitting: Podiatry

## 2015-04-04 ENCOUNTER — Encounter: Payer: Self-pay | Admitting: Podiatry

## 2015-04-30 ENCOUNTER — Encounter: Payer: BLUE CROSS/BLUE SHIELD | Admitting: Podiatry

## 2015-05-03 ENCOUNTER — Ambulatory Visit: Payer: BLUE CROSS/BLUE SHIELD | Admitting: Podiatry

## 2015-05-13 ENCOUNTER — Encounter: Payer: Self-pay | Admitting: Podiatry

## 2015-05-13 ENCOUNTER — Ambulatory Visit (INDEPENDENT_AMBULATORY_CARE_PROVIDER_SITE_OTHER): Payer: BLUE CROSS/BLUE SHIELD | Admitting: Podiatry

## 2015-05-13 ENCOUNTER — Ambulatory Visit (INDEPENDENT_AMBULATORY_CARE_PROVIDER_SITE_OTHER): Payer: BLUE CROSS/BLUE SHIELD

## 2015-05-13 VITALS — BP 121/88 | HR 97 | Resp 18

## 2015-05-13 DIAGNOSIS — R52 Pain, unspecified: Secondary | ICD-10-CM | POA: Diagnosis not present

## 2015-05-13 DIAGNOSIS — S93402A Sprain of unspecified ligament of left ankle, initial encounter: Secondary | ICD-10-CM

## 2015-05-13 DIAGNOSIS — M779 Enthesopathy, unspecified: Secondary | ICD-10-CM | POA: Diagnosis not present

## 2015-05-13 MED ORDER — TRIAMCINOLONE ACETONIDE 10 MG/ML IJ SUSP
10.0000 mg | Freq: Once | INTRAMUSCULAR | Status: AC
  Administered 2015-05-13: 10 mg

## 2015-05-14 NOTE — Progress Notes (Signed)
Subjective:     Patient ID: Tracie Robinson, female   DOB: Apr 01, 1971, 44 y.o.   MRN: [id number]  HPI patient presents stating my left ankle is still sore when pressed and I am just worried I might of broken bone   Review of Systems     Objective:   Physical Exam Neurovascular status intact with history of having trauma to the left ankle with inflammation and fluid especially within the sinus tarsi. States that she is able to bear weight but she still is concerned    Assessment:     Probable sprained ankle with inflammatory changes and cannot rule out other pathology    Plan:     X-rays reviewed with patient at this time I did sinus tarsi injection 3 Milligan Kenalog 5 mg Xylocaine and advised on reduced activity. If symptoms persist reappoint

## 2015-05-21 NOTE — Progress Notes (Signed)
This encounter was created in error - please disregard.

## 2015-09-26 ENCOUNTER — Ambulatory Visit: Payer: BLUE CROSS/BLUE SHIELD | Admitting: Podiatry

## 2015-11-18 ENCOUNTER — Ambulatory Visit: Payer: BLUE CROSS/BLUE SHIELD | Admitting: Podiatry

## 2016-03-10 ENCOUNTER — Telehealth: Payer: Self-pay | Admitting: *Deleted

## 2016-03-10 NOTE — Telephone Encounter (Addendum)
Pt states she has lots of questions.  03/10/2016-Left message encouraging pt to make an appt LOV 04/2015 and she may have a new problem or the previous problem may need new testing and evaluation.  03/20/2016-[employer] PRIOR AUTHORIZATION #[id number], VALID 03/20/2016 TO 04/18/2016.  [facility].

## 2016-03-12 ENCOUNTER — Ambulatory Visit: Payer: BLUE CROSS/BLUE SHIELD | Admitting: Podiatry

## 2016-03-19 ENCOUNTER — Encounter: Payer: Self-pay | Admitting: Podiatry

## 2016-03-19 ENCOUNTER — Ambulatory Visit: Payer: Self-pay

## 2016-03-19 ENCOUNTER — Ambulatory Visit (INDEPENDENT_AMBULATORY_CARE_PROVIDER_SITE_OTHER): Payer: BLUE CROSS/BLUE SHIELD | Admitting: Podiatry

## 2016-03-19 ENCOUNTER — Ambulatory Visit (INDEPENDENT_AMBULATORY_CARE_PROVIDER_SITE_OTHER): Payer: BLUE CROSS/BLUE SHIELD

## 2016-03-19 VITALS — BP 139/96 | HR 88 | Resp 16

## 2016-03-19 DIAGNOSIS — M25572 Pain in left ankle and joints of left foot: Secondary | ICD-10-CM

## 2016-03-19 DIAGNOSIS — T148 Other injury of unspecified body region: Secondary | ICD-10-CM

## 2016-03-19 DIAGNOSIS — M779 Enthesopathy, unspecified: Secondary | ICD-10-CM | POA: Diagnosis not present

## 2016-03-19 DIAGNOSIS — T148XXA Other injury of unspecified body region, initial encounter: Secondary | ICD-10-CM

## 2016-03-19 DIAGNOSIS — R52 Pain, unspecified: Secondary | ICD-10-CM

## 2016-03-19 MED ORDER — GABAPENTIN 300 MG PO CAPS: 300.0000 mg | ORAL_CAPSULE | Freq: Three times a day (TID) | ORAL | Status: AC

## 2016-03-19 MED ORDER — HYDROCODONE-ACETAMINOPHEN 10-325 MG PO TABS: 1.0000 | ORAL_TABLET | Freq: Three times a day (TID) | ORAL | Status: DC | PRN

## 2016-03-20 NOTE — Progress Notes (Signed)
Subjective:     Patient ID: Ilda MoriCynthia P Uber, female   DOB: 18-Mar-1971, 45 y.o.   MRN: [id number]  HPI patient states she's getting a lot of pain in her left ankle and in the lateral side of the joint in the area of the peroneal tendon group. We had removed her plate which did relieve some of her more proximal pain but this pain has been chronic and has been unrelenting in its nature. She states at times her ankle gives away completely is had ankle sprains   Review of Systems     Objective:   Physical Exam Neurovascular status found to be intact muscle strength was adequate with mild excessive inversion left ankle with quite a bit of discomfort in the peroneal tendon group as it comes behind the lateral malleolus and under the lateral malleolus. There is mild to moderate fusiform swelling of this area and it is very tender when palpated    Assessment:     Stability for split tear of the peroneal group left or lateral ankle instability with possibility for ligamentous injury of a chronic nature secondary to previous fracture of the fibula    Plan:     H&P and x-rays reviewed with patient. Due to the chronic nature of this condition and continued discomfort despite utilization of plate removal and previous procedures MRI will be ordered in order to make sure there is not a split tear of peroneal tendon or damage to the lateral ankle area. Will be seen back when they are completed  Multiple views left indicated that there is mild arthritis lateral ankle no indications of diastases injury. We did place her on gabapentin 300 mg try to reduce the burning she is experiencing and hydrocodone for nighttime usage to try to help her sleep and reduce pain

## 2016-03-21 ENCOUNTER — Other Ambulatory Visit: Payer: Self-pay

## 2016-03-28 ENCOUNTER — Other Ambulatory Visit: Payer: Self-pay

## 2016-03-30 ENCOUNTER — Other Ambulatory Visit: Payer: Self-pay

## 2016-04-13 ENCOUNTER — Ambulatory Visit: Payer: BLUE CROSS/BLUE SHIELD | Admitting: Podiatry

## 2016-07-08 ENCOUNTER — Ambulatory Visit: Payer: BLUE CROSS/BLUE SHIELD | Admitting: Podiatry

## 2016-07-15 ENCOUNTER — Encounter: Payer: Self-pay | Admitting: Podiatry

## 2016-07-15 ENCOUNTER — Ambulatory Visit (INDEPENDENT_AMBULATORY_CARE_PROVIDER_SITE_OTHER): Payer: BLUE CROSS/BLUE SHIELD

## 2016-07-15 ENCOUNTER — Ambulatory Visit (INDEPENDENT_AMBULATORY_CARE_PROVIDER_SITE_OTHER): Payer: BLUE CROSS/BLUE SHIELD | Admitting: Podiatry

## 2016-07-15 VITALS — BP 109/77 | HR 97 | Resp 16

## 2016-07-15 DIAGNOSIS — M779 Enthesopathy, unspecified: Secondary | ICD-10-CM

## 2016-07-15 DIAGNOSIS — M79671 Pain in right foot: Secondary | ICD-10-CM | POA: Diagnosis not present

## 2016-07-15 DIAGNOSIS — L03119 Cellulitis of unspecified part of limb: Secondary | ICD-10-CM | POA: Diagnosis not present

## 2016-07-15 DIAGNOSIS — L02619 Cutaneous abscess of unspecified foot: Secondary | ICD-10-CM | POA: Diagnosis not present

## 2016-07-15 MED ORDER — TRIAMCINOLONE ACETONIDE 10 MG/ML IJ SUSP
10.0000 mg | Freq: Once | INTRAMUSCULAR | Status: AC
  Administered 2016-07-15: 10 mg

## 2016-07-15 MED ORDER — HYDROCODONE-ACETAMINOPHEN 10-325 MG PO TABS: 1.0000 | ORAL_TABLET | Freq: Three times a day (TID) | ORAL | 0 refills | Status: DC | PRN

## 2016-07-15 MED ORDER — CEPHALEXIN 500 MG PO CAPS: 500.0000 mg | ORAL_CAPSULE | Freq: Three times a day (TID) | ORAL | 1 refills | Status: DC

## 2016-07-15 NOTE — Progress Notes (Signed)
Subjective:     Patient ID: Tracie Robinson, female   DOB: 10/12/1971, 45 y.o.   MRN: [id number]  HPI patient presents with pain in the left ankle which is reoccurred and concerned that she may have a infection of her right forefoot with several crusted lesions and she did work around my needles and stated she had several spots   Review of Systems  All other systems reviewed and are negative.      Objective:   Physical Exam  Constitutional: She is oriented to person, place, and time.  Cardiovascular: Intact distal pulses.   Musculoskeletal: Normal range of motion.  Neurological: She is oriented to person, place, and time.  Skin: Skin is warm and dry.  Nursing note and vitals reviewed.  neurovascular status intact muscle strength adequate range of motion within normal limits with patient noted to have several crusted lesions dorsal right but no active drainage no erythema no edema or swelling noted. 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 For the left I injected the sinus tarsi 3 mg Kenalog 5 mill grams Xylocaine and advised on reduced activity

## 2016-10-02 ENCOUNTER — Other Ambulatory Visit: Payer: Self-pay | Admitting: Podiatry

## 2016-12-17 ENCOUNTER — Ambulatory Visit: Payer: BLUE CROSS/BLUE SHIELD | Admitting: Podiatry

## 2017-02-04 ENCOUNTER — Encounter: Payer: Self-pay | Admitting: Physician Assistant

## 2017-02-11 ENCOUNTER — Telehealth: Payer: Self-pay | Admitting: *Deleted

## 2017-02-11 ENCOUNTER — Ambulatory Visit: Payer: Self-pay | Admitting: Physician Assistant

## 2017-02-11 NOTE — Telephone Encounter (Addendum)
Pt states she fell a few nights ago and has an appt tomorrow 11:15am, right foot needs and injection, but can't raise her arm and she's bruise all down the side and under her breast. I told pt, if she couldn't lift her arm and was bruised and in that much pain she should go to her PCP, orthopedic doctor or ER, Dr. Charlsie Merlesegal would not give pain medications for all those area. Pt states understanding and she is also hoping Dr. Charlsie Merlesegal will give her a shot in the right foot. I told pt he very well may, but she needs to get the other areas taken care of as well. Pt then asked if Dr. Charlsie Merlesegal would be on call tonight and I told her no, that another doctor would be and our office would not give narcotic pain medication after hours. 02/15/2017-Pt states she has hurt both ankles and her shoulder and arm and would like Dr. Charlsie Merlesegal to see her and to refer for the other injuries. 02/23/2017-Pt states has a shattered upper arm, and is to have surgery, but would like Dr. Beverlee Nimsegal's referral to an orthopedic doctor. 02/24/2017-Unable to leave message from Dr. Charlsie Merlesegal suggesting the [facility], [address] [place], [place] [address], [phone].

## 2017-02-12 ENCOUNTER — Ambulatory Visit: Payer: BLUE CROSS/BLUE SHIELD | Admitting: Podiatry

## 2017-02-17 ENCOUNTER — Ambulatory Visit: Payer: BLUE CROSS/BLUE SHIELD | Admitting: Podiatry

## 2017-02-21 ENCOUNTER — Emergency Department: Payer: BLUE CROSS/BLUE SHIELD

## 2017-02-21 ENCOUNTER — Encounter: Payer: Self-pay | Admitting: Emergency Medicine

## 2017-02-21 ENCOUNTER — Emergency Department
Admission: EM | Admit: 2017-02-21 | Discharge: 2017-02-21 | Disposition: A | Discharge status: Home / Self Care | Payer: BLUE CROSS/BLUE SHIELD | Attending: Emergency Medicine | Admitting: Emergency Medicine

## 2017-02-21 DIAGNOSIS — Y939 Activity, unspecified: Secondary | ICD-10-CM | POA: Insufficient documentation

## 2017-02-21 DIAGNOSIS — S42211A Unspecified displaced fracture of surgical neck of right humerus, initial encounter for closed fracture: Secondary | ICD-10-CM | POA: Insufficient documentation

## 2017-02-21 DIAGNOSIS — F17210 Nicotine dependence, cigarettes, uncomplicated: Secondary | ICD-10-CM | POA: Insufficient documentation

## 2017-02-21 DIAGNOSIS — S42291A Other displaced fracture of upper end of right humerus, initial encounter for closed fracture: Secondary | ICD-10-CM | POA: Diagnosis not present

## 2017-02-21 DIAGNOSIS — M25572 Pain in left ankle and joints of left foot: Secondary | ICD-10-CM | POA: Insufficient documentation

## 2017-02-21 DIAGNOSIS — M25571 Pain in right ankle and joints of right foot: Secondary | ICD-10-CM | POA: Diagnosis not present

## 2017-02-21 DIAGNOSIS — Z79899 Other long term (current) drug therapy: Secondary | ICD-10-CM | POA: Insufficient documentation

## 2017-02-21 DIAGNOSIS — S6991XA Unspecified injury of right wrist, hand and finger(s), initial encounter: Secondary | ICD-10-CM | POA: Diagnosis present

## 2017-02-21 DIAGNOSIS — Y929 Unspecified place or not applicable: Secondary | ICD-10-CM | POA: Diagnosis not present

## 2017-02-21 DIAGNOSIS — W1839XA Other fall on same level, initial encounter: Secondary | ICD-10-CM | POA: Insufficient documentation

## 2017-02-21 DIAGNOSIS — Y999 Unspecified external cause status: Secondary | ICD-10-CM | POA: Insufficient documentation

## 2017-02-21 DIAGNOSIS — T07XXXA Unspecified multiple injuries, initial encounter: Secondary | ICD-10-CM

## 2017-02-21 DIAGNOSIS — T148XXA Other injury of unspecified body region, initial encounter: Secondary | ICD-10-CM | POA: Insufficient documentation

## 2017-02-21 DIAGNOSIS — G8929 Other chronic pain: Secondary | ICD-10-CM | POA: Insufficient documentation

## 2017-02-21 MED ORDER — HYDROCODONE-ACETAMINOPHEN 5-325 MG PO TABS
2.0000 | ORAL_TABLET | Freq: Once | ORAL | Status: AC
  Administered 2017-02-21: 2 via ORAL

## 2017-02-21 MED ORDER — HYDROCODONE-ACETAMINOPHEN 5-325 MG PO TABS: 1.0000 | ORAL_TABLET | ORAL | 0 refills | Status: DC | PRN

## 2017-02-21 MED ORDER — DOCUSATE SODIUM 100 MG PO CAPS: ORAL_CAPSULE | ORAL | 0 refills | Status: AC

## 2017-02-21 MED ORDER — HYDROCODONE-ACETAMINOPHEN 5-325 MG PO TABS: 2.0000 | ORAL_TABLET | Freq: Once | ORAL | Status: DC

## 2017-02-21 MED ORDER — HYDROCODONE-ACETAMINOPHEN 5-325 MG PO TABS
ORAL_TABLET | ORAL | Status: AC
  Administered 2017-02-21: 2 via ORAL
  Filled 2017-02-21: qty 2

## 2017-02-21 NOTE — Discharge Instructions (Signed)
As we discussed, you have a rather severe fracture of her upper right arm.  Please use the sling we provided to take the weight off of your arm and shoulder and call the office of Dr. Joice LoftsPoggi at the number listed as soon as possible on Monday morning to set up the next available appointment.  You may need surgery to correct this fracture and it is important that he follow up with him or one of his colleagues at the next available opportunity.   Take Norco as prescribed for severe pain. Do not drink alcohol, drive or participate in any other potentially dangerous activities while taking this medication as it may make you sleepy. Do not take this medication with any other sedating medications, either prescription or over-the-counter. If you were prescribed Percocet or Vicodin, do not take these with acetaminophen (Tylenol) as it is already contained within these medications.   This medication is an opiate (or narcotic) pain medication and can be habit forming.  Use it as little as possible to achieve adequate pain control.  Do not use or use it with extreme caution if you have a history of opiate abuse or dependence.  If you are on a pain contract with your primary care doctor or a pain specialist, be sure to let them know you were prescribed this medication today from the [facility].  This medication is intended for your use only - do not give any to anyone else and keep it in a secure place where nobody else, especially children, have access to it.  It will also cause or worsen constipation, so you may want to consider taking an over-the-counter stool softener while you are taking this medication.  You may wish to follow up with your foot doctor as well but please do address the issue with your arm on Monday.  Given that you have had multiple recent falls we encourage you to be as cautious and careful as possible, do not mix your medications with any alcohol or anxiety medicine, and  try to receive assistance from family members or friends as much as possible to limit your risk of additional falls.

## 2017-02-21 NOTE — ED Triage Notes (Signed)
Pt to rm 4 via EMS from home.  EMS report pt has had multiple falls due to left ankle weakness, chronic.  EMS report pt hit right arm on table during fall, bruising noted to right chest, swelling noted to right hand, radial pulse strong, cap refill brisk.  EMS report possible deformity felt when humerous palpated, intense pain upon palpation.  No obvious deformity upon visualization.  VSS en route.

## 2017-02-21 NOTE — ED Notes (Signed)
ED Provider at bedside. 
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
## 2017-02-24 ENCOUNTER — Encounter (HOSPITAL_COMMUNITY): Payer: Self-pay | Admitting: *Deleted

## 2017-02-24 NOTE — Progress Notes (Signed)
Spoke with pt for pre-op call. Pt denies cardiac history, chest pain or sob. Pt denies being a diabetic. Instructed pt to not smoke 24 hours prior to surgery.

## 2017-02-24 NOTE — Telephone Encounter (Signed)
I'm not sure who would be best. [facility]

## 2017-02-25 ENCOUNTER — Encounter (HOSPITAL_COMMUNITY)
Admission: RE | Disposition: A | Discharge status: Home / Self Care | Payer: Self-pay | Source: Ambulatory Visit | Attending: Orthopedic Surgery

## 2017-02-25 ENCOUNTER — Observation Stay (HOSPITAL_COMMUNITY)
Admission: RE | Admit: 2017-02-25 | Discharge: 2017-02-26 | Disposition: A | Discharge status: Home / Self Care | Payer: BLUE CROSS/BLUE SHIELD | Source: Ambulatory Visit | Attending: Orthopedic Surgery | Admitting: Orthopedic Surgery

## 2017-02-25 ENCOUNTER — Ambulatory Visit (HOSPITAL_COMMUNITY): Payer: BLUE CROSS/BLUE SHIELD

## 2017-02-25 ENCOUNTER — Ambulatory Visit (HOSPITAL_COMMUNITY): Payer: BLUE CROSS/BLUE SHIELD | Admitting: Anesthesiology

## 2017-02-25 ENCOUNTER — Encounter (HOSPITAL_COMMUNITY): Payer: Self-pay

## 2017-02-25 DIAGNOSIS — G5602 Carpal tunnel syndrome, left upper limb: Secondary | ICD-10-CM | POA: Insufficient documentation

## 2017-02-25 DIAGNOSIS — Z90710 Acquired absence of both cervix and uterus: Secondary | ICD-10-CM | POA: Diagnosis not present

## 2017-02-25 DIAGNOSIS — Z8249 Family history of ischemic heart disease and other diseases of the circulatory system: Secondary | ICD-10-CM | POA: Diagnosis not present

## 2017-02-25 DIAGNOSIS — F988 Other specified behavioral and emotional disorders with onset usually occurring in childhood and adolescence: Secondary | ICD-10-CM | POA: Insufficient documentation

## 2017-02-25 DIAGNOSIS — K5900 Constipation, unspecified: Secondary | ICD-10-CM | POA: Insufficient documentation

## 2017-02-25 DIAGNOSIS — Z8781 Personal history of (healed) traumatic fracture: Secondary | ICD-10-CM | POA: Diagnosis present

## 2017-02-25 DIAGNOSIS — F329 Major depressive disorder, single episode, unspecified: Secondary | ICD-10-CM | POA: Diagnosis not present

## 2017-02-25 DIAGNOSIS — Z7982 Long term (current) use of aspirin: Secondary | ICD-10-CM | POA: Diagnosis not present

## 2017-02-25 DIAGNOSIS — G8929 Other chronic pain: Secondary | ICD-10-CM | POA: Insufficient documentation

## 2017-02-25 DIAGNOSIS — Y939 Activity, unspecified: Secondary | ICD-10-CM | POA: Diagnosis not present

## 2017-02-25 DIAGNOSIS — W19XXXA Unspecified fall, initial encounter: Secondary | ICD-10-CM | POA: Diagnosis not present

## 2017-02-25 DIAGNOSIS — S42201A Unspecified fracture of upper end of right humerus, initial encounter for closed fracture: Secondary | ICD-10-CM | POA: Diagnosis not present

## 2017-02-25 DIAGNOSIS — F17210 Nicotine dependence, cigarettes, uncomplicated: Secondary | ICD-10-CM | POA: Insufficient documentation

## 2017-02-25 DIAGNOSIS — Z79899 Other long term (current) drug therapy: Secondary | ICD-10-CM | POA: Insufficient documentation

## 2017-02-25 DIAGNOSIS — Z419 Encounter for procedure for purposes other than remedying health state, unspecified: Secondary | ICD-10-CM

## 2017-02-25 DIAGNOSIS — F419 Anxiety disorder, unspecified: Secondary | ICD-10-CM | POA: Insufficient documentation

## 2017-02-25 DIAGNOSIS — S42291S Other displaced fracture of upper end of right humerus, sequela: Secondary | ICD-10-CM

## 2017-02-25 DIAGNOSIS — Z967 Presence of other bone and tendon implants: Secondary | ICD-10-CM | POA: Diagnosis present

## 2017-02-25 DIAGNOSIS — M1990 Unspecified osteoarthritis, unspecified site: Secondary | ICD-10-CM | POA: Diagnosis not present

## 2017-02-25 DIAGNOSIS — R51 Headache: Secondary | ICD-10-CM | POA: Insufficient documentation

## 2017-02-25 DIAGNOSIS — Z9889 Other specified postprocedural states: Secondary | ICD-10-CM

## 2017-02-25 HISTORY — DX: Constipation, unspecified: K59.00

## 2017-02-25 HISTORY — DX: Carpal tunnel syndrome, left upper limb: G56.02

## 2017-02-25 HISTORY — DX: Other specified behavioral and emotional disorders with onset usually occurring in childhood and adolescence: F98.8

## 2017-02-25 HISTORY — DX: Headache: R51

## 2017-02-25 HISTORY — DX: Family history of other specified conditions: Z84.89

## 2017-02-25 HISTORY — DX: Major depressive disorder, single episode, unspecified: F32.9

## 2017-02-25 HISTORY — DX: Anxiety disorder, unspecified: F41.9

## 2017-02-25 HISTORY — DX: Headache, unspecified: R51.9

## 2017-02-25 HISTORY — DX: Other chronic pain: G89.29

## 2017-02-25 HISTORY — DX: Depression, unspecified: F32.A

## 2017-02-25 HISTORY — PX: ORIF HUMERUS FRACTURE: SHX2126

## 2017-02-25 HISTORY — DX: Unspecified osteoarthritis, unspecified site: M19.90

## 2017-02-25 LAB — CBC
HCT: 36.7 % (ref 36.0–46.0)
Hemoglobin: 11.7 g/dL — ABNORMAL LOW (ref 12.0–15.0)
MCH: 33.8 pg (ref 26.0–34.0)
MCHC: 31.9 g/dL (ref 30.0–36.0)
MCV: 106.1 fL — AB (ref 78.0–100.0)
Platelets: 172 10*3/uL (ref 150–400)
RBC: 3.46 MIL/uL — ABNORMAL LOW (ref 3.87–5.11)
RDW: 16.2 % — ABNORMAL HIGH (ref 11.5–15.5)
WBC: 5.9 10*3/uL (ref 4.0–10.5)

## 2017-02-25 SURGERY — OPEN REDUCTION INTERNAL FIXATION (ORIF) PROXIMAL HUMERUS FRACTURE
Anesthesia: General | Laterality: Right

## 2017-02-25 MED ORDER — ONDANSETRON HCL 4 MG PO TABS: 4.0000 mg | ORAL_TABLET | Freq: Four times a day (QID) | ORAL | Status: DC | PRN

## 2017-02-25 MED ORDER — LIDOCAINE HCL (CARDIAC) 20 MG/ML IV SOLN
INTRAVENOUS | Status: DC | PRN
  Administered 2017-02-25: 60 mg via INTRAVENOUS

## 2017-02-25 MED ORDER — LIDOCAINE 2% (20 MG/ML) 5 ML SYRINGE
INTRAMUSCULAR | Status: AC
  Filled 2017-02-25: qty 5

## 2017-02-25 MED ORDER — LACTATED RINGERS IV SOLN
INTRAVENOUS | Status: DC
  Administered 2017-02-25: 14:00:00 via INTRAVENOUS

## 2017-02-25 MED ORDER — METOCLOPRAMIDE HCL 5 MG/ML IJ SOLN: 5.0000 mg | Freq: Three times a day (TID) | INTRAMUSCULAR | Status: DC | PRN

## 2017-02-25 MED ORDER — HYDROMORPHONE HCL 2 MG/ML IJ SOLN
1.0000 mg | INTRAMUSCULAR | Status: DC | PRN
  Administered 2017-02-26: 1 mg via INTRAVENOUS
  Filled 2017-02-25: qty 1

## 2017-02-25 MED ORDER — FENTANYL CITRATE (PF) 100 MCG/2ML IJ SOLN
50.0000 ug | INTRAMUSCULAR | Status: DC | PRN
  Administered 2017-02-25: 50 ug via INTRAVENOUS

## 2017-02-25 MED ORDER — PROPOFOL 10 MG/ML IV BOLUS
INTRAVENOUS | Status: DC | PRN
  Administered 2017-02-25: 160 mg via INTRAVENOUS

## 2017-02-25 MED ORDER — METOCLOPRAMIDE HCL 5 MG PO TABS: 5.0000 mg | ORAL_TABLET | Freq: Three times a day (TID) | ORAL | Status: DC | PRN

## 2017-02-25 MED ORDER — ALPRAZOLAM 0.5 MG PO TABS
1.0000 mg | ORAL_TABLET | Freq: Four times a day (QID) | ORAL | Status: DC | PRN
  Administered 2017-02-26: 1 mg via ORAL
  Filled 2017-02-25 (×2): qty 2

## 2017-02-25 MED ORDER — ACETAMINOPHEN 325 MG PO TABS: 650.0000 mg | ORAL_TABLET | Freq: Four times a day (QID) | ORAL | Status: DC | PRN

## 2017-02-25 MED ORDER — ACETAMINOPHEN 650 MG RE SUPP: 650.0000 mg | Freq: Four times a day (QID) | RECTAL | Status: DC | PRN

## 2017-02-25 MED ORDER — FENTANYL CITRATE (PF) 100 MCG/2ML IJ SOLN
INTRAMUSCULAR | Status: AC
  Administered 2017-02-25: 50 ug via INTRAVENOUS
  Filled 2017-02-25: qty 2

## 2017-02-25 MED ORDER — LACTATED RINGERS IV SOLN: INTRAVENOUS | Status: DC

## 2017-02-25 MED ORDER — MIDAZOLAM HCL 2 MG/2ML IJ SOLN
INTRAMUSCULAR | Status: AC
  Filled 2017-02-25: qty 2

## 2017-02-25 MED ORDER — PROMETHAZINE HCL 25 MG/ML IJ SOLN: 6.2500 mg | INTRAMUSCULAR | Status: DC | PRN

## 2017-02-25 MED ORDER — DEXTROSE 5 % IV SOLN
500.0000 mg | Freq: Four times a day (QID) | INTRAVENOUS | Status: DC | PRN
  Filled 2017-02-25: qty 5

## 2017-02-25 MED ORDER — DOCUSATE SODIUM 100 MG PO CAPS
100.0000 mg | ORAL_CAPSULE | Freq: Two times a day (BID) | ORAL | Status: DC
  Administered 2017-02-25 – 2017-02-26 (×2): 100 mg via ORAL
  Filled 2017-02-25 (×2): qty 1

## 2017-02-25 MED ORDER — ASPIRIN EC 325 MG PO TBEC
325.0000 mg | DELAYED_RELEASE_TABLET | Freq: Every day | ORAL | Status: DC
  Administered 2017-02-25 – 2017-02-26 (×2): 325 mg via ORAL
  Filled 2017-02-25 (×2): qty 1

## 2017-02-25 MED ORDER — POLYETHYLENE GLYCOL 3350 17 G PO PACK: 17.0000 g | PACK | Freq: Every day | ORAL | Status: DC | PRN

## 2017-02-25 MED ORDER — PHENOL 1.4 % MT LIQD: 1.0000 | OROMUCOSAL | Status: DC | PRN

## 2017-02-25 MED ORDER — BISACODYL 5 MG PO TBEC: 5.0000 mg | DELAYED_RELEASE_TABLET | Freq: Every day | ORAL | Status: DC | PRN

## 2017-02-25 MED ORDER — VECURONIUM BROMIDE 10 MG IV SOLR
INTRAVENOUS | Status: DC | PRN
  Administered 2017-02-25: 1 mg via INTRAVENOUS
  Administered 2017-02-25: 7 mg via INTRAVENOUS
  Administered 2017-02-25: 1 mg via INTRAVENOUS

## 2017-02-25 MED ORDER — PHENYLEPHRINE HCL 10 MG/ML IJ SOLN
INTRAVENOUS | Status: DC | PRN
  Administered 2017-02-25: 50 ug/min via INTRAVENOUS

## 2017-02-25 MED ORDER — MEPERIDINE HCL 25 MG/ML IJ SOLN: 6.2500 mg | INTRAMUSCULAR | Status: DC | PRN

## 2017-02-25 MED ORDER — MIDAZOLAM HCL 2 MG/2ML IJ SOLN
1.0000 mg | INTRAMUSCULAR | Status: DC | PRN
  Administered 2017-02-25: 2 mg via INTRAVENOUS

## 2017-02-25 MED ORDER — FENTANYL CITRATE (PF) 100 MCG/2ML IJ SOLN: 25.0000 ug | INTRAMUSCULAR | Status: DC | PRN

## 2017-02-25 MED ORDER — MIDAZOLAM HCL 5 MG/5ML IJ SOLN
INTRAMUSCULAR | Status: DC | PRN
Start: 2017-02-25 — End: 2017-02-25
  Administered 2017-02-25 (×2): 2 mg via INTRAVENOUS

## 2017-02-25 MED ORDER — MENTHOL 3 MG MT LOZG: 1.0000 | LOZENGE | OROMUCOSAL | Status: DC | PRN

## 2017-02-25 MED ORDER — FENTANYL CITRATE (PF) 100 MCG/2ML IJ SOLN
INTRAMUSCULAR | Status: DC | PRN
  Administered 2017-02-25: 100 ug via INTRAVENOUS

## 2017-02-25 MED ORDER — DIPHENHYDRAMINE HCL 12.5 MG/5ML PO ELIX: 12.5000 mg | ORAL_SOLUTION | ORAL | Status: DC | PRN

## 2017-02-25 MED ORDER — ONDANSETRON HCL 4 MG/2ML IJ SOLN: 4.0000 mg | Freq: Four times a day (QID) | INTRAMUSCULAR | Status: DC | PRN

## 2017-02-25 MED ORDER — DESVENLAFAXINE SUCCINATE ER 50 MG PO TB24
50.0000 mg | ORAL_TABLET | Freq: Every morning | ORAL | Status: DC
  Administered 2017-02-26: 50 mg via ORAL
  Filled 2017-02-25: qty 1

## 2017-02-25 MED ORDER — MIDAZOLAM HCL 2 MG/2ML IJ SOLN
INTRAMUSCULAR | Status: AC
  Administered 2017-02-25: 2 mg via INTRAVENOUS
  Filled 2017-02-25: qty 2

## 2017-02-25 MED ORDER — SUGAMMADEX SODIUM 200 MG/2ML IV SOLN
INTRAVENOUS | Status: DC | PRN
  Administered 2017-02-25: 200 mg via INTRAVENOUS

## 2017-02-25 MED ORDER — CEFAZOLIN SODIUM-DEXTROSE 2-4 GM/100ML-% IV SOLN
INTRAVENOUS | Status: AC
  Filled 2017-02-25: qty 100

## 2017-02-25 MED ORDER — CEFAZOLIN SODIUM-DEXTROSE 2-4 GM/100ML-% IV SOLN
2.0000 g | INTRAVENOUS | Status: AC
  Administered 2017-02-25: 2 g via INTRAVENOUS

## 2017-02-25 MED ORDER — LACTATED RINGERS IV SOLN
INTRAVENOUS | Status: DC | PRN
  Administered 2017-02-25 (×2): via INTRAVENOUS

## 2017-02-25 MED ORDER — METHOCARBAMOL 500 MG PO TABS
500.0000 mg | ORAL_TABLET | Freq: Four times a day (QID) | ORAL | Status: DC | PRN
  Administered 2017-02-26: 500 mg via ORAL
  Filled 2017-02-25 (×2): qty 1

## 2017-02-25 MED ORDER — PROPOFOL 10 MG/ML IV BOLUS
INTRAVENOUS | Status: AC
  Filled 2017-02-25: qty 20

## 2017-02-25 MED ORDER — MAGNESIUM CITRATE PO SOLN: 1.0000 | Freq: Once | ORAL | Status: DC | PRN

## 2017-02-25 MED ORDER — FLUOXETINE HCL 20 MG PO CAPS
40.0000 mg | ORAL_CAPSULE | Freq: Every day | ORAL | Status: DC
  Administered 2017-02-26: 40 mg via ORAL
  Filled 2017-02-25: qty 2

## 2017-02-25 MED ORDER — GABAPENTIN 300 MG PO CAPS
300.0000 mg | ORAL_CAPSULE | Freq: Three times a day (TID) | ORAL | Status: DC
  Administered 2017-02-25 – 2017-02-26 (×2): 300 mg via ORAL
  Filled 2017-02-25 (×2): qty 1

## 2017-02-25 MED ORDER — HYDROMORPHONE HCL 2 MG PO TABS
2.0000 mg | ORAL_TABLET | ORAL | Status: DC | PRN
  Administered 2017-02-25 – 2017-02-26 (×3): 4 mg via ORAL
  Filled 2017-02-25: qty 2
  Filled 2017-02-25 (×2): qty 1
  Filled 2017-02-25: qty 2

## 2017-02-25 MED ORDER — CEFAZOLIN SODIUM-DEXTROSE 2-4 GM/100ML-% IV SOLN
2.0000 g | Freq: Four times a day (QID) | INTRAVENOUS | Status: AC
  Administered 2017-02-25 – 2017-02-26 (×3): 2 g via INTRAVENOUS
  Filled 2017-02-25 (×3): qty 100

## 2017-02-25 MED ORDER — CHLORHEXIDINE GLUCONATE 4 % EX LIQD: 60.0000 mL | Freq: Once | CUTANEOUS | Status: DC

## 2017-02-25 MED ORDER — BUPIVACAINE-EPINEPHRINE (PF) 0.5% -1:200000 IJ SOLN
INTRAMUSCULAR | Status: DC | PRN
  Administered 2017-02-25: 15 mL via PERINEURAL

## 2017-02-25 MED ORDER — PHENYLEPHRINE HCL 10 MG/ML IJ SOLN
INTRAMUSCULAR | Status: DC | PRN
  Administered 2017-02-25 (×3): 80 ug via INTRAVENOUS

## 2017-02-25 MED ORDER — 0.9 % SODIUM CHLORIDE (POUR BTL) OPTIME
TOPICAL | Status: DC | PRN
  Administered 2017-02-25: 1000 mL

## 2017-02-25 MED ORDER — FENTANYL CITRATE (PF) 100 MCG/2ML IJ SOLN
INTRAMUSCULAR | Status: AC
  Filled 2017-02-25: qty 2

## 2017-02-25 MED ORDER — AMPHETAMINE-DEXTROAMPHET ER 10 MG PO CP24
20.0000 mg | ORAL_CAPSULE | Freq: Every morning | ORAL | Status: DC
  Administered 2017-02-26: 20 mg via ORAL
  Filled 2017-02-25: qty 2

## 2017-02-25 SURGICAL SUPPLY — 70 items
ADH SKN CLS APL DERMABOND .7 (GAUZE/BANDAGES/DRESSINGS) ×1
AID PSTN UNV HD RSTRNT DISP (MISCELLANEOUS) ×1
BIT DRILL 3.2 (BIT) ×3
BIT DRILL 3.2XCALB NS DISP (BIT) IMPLANT
BIT DRILL CALIBRATED 2.7 (BIT) ×1 IMPLANT
BIT DRILL CALIBRATED 2.7MM (BIT) ×1
BIT DRL 3.2XCALB NS DISP (BIT) ×1
COVER SURGICAL LIGHT HANDLE (MISCELLANEOUS) ×3 IMPLANT
DERMABOND ADVANCED (GAUZE/BANDAGES/DRESSINGS) ×2
DERMABOND ADVANCED .7 DNX12 (GAUZE/BANDAGES/DRESSINGS) IMPLANT
DRAPE C-ARM 42X72 X-RAY (DRAPES) ×3 IMPLANT
DRAPE INCISE IOBAN 66X45 STRL (DRAPES) ×3 IMPLANT
DRAPE ORTHO SPLIT 77X108 STRL (DRAPES) ×6
DRAPE SURG ORHT 6 SPLT 77X108 (DRAPES) ×2 IMPLANT
DRAPE U-SHAPE 47X51 STRL (DRAPES) ×3 IMPLANT
DRSG AQUACEL AG ADV 3.5X10 (GAUZE/BANDAGES/DRESSINGS) ×3 IMPLANT
DRSG MEPILEX BORDER 4X8 (GAUZE/BANDAGES/DRESSINGS) ×3 IMPLANT
DURAPREP 26ML APPLICATOR (WOUND CARE) ×3 IMPLANT
ELECT REM PT RETURN 9FT ADLT (ELECTROSURGICAL) ×3
ELECTRODE REM PT RTRN 9FT ADLT (ELECTROSURGICAL) ×1 IMPLANT
GLOVE BIO SURGEON STRL SZ7.5 (GLOVE) ×3 IMPLANT
GLOVE BIO SURGEON STRL SZ8 (GLOVE) ×3 IMPLANT
GLOVE EUDERMIC 7 POWDERFREE (GLOVE) ×6 IMPLANT
GLOVE SS BIOGEL STRL SZ 7.5 (GLOVE) ×2 IMPLANT
GLOVE SUPERSENSE BIOGEL SZ 7.5 (GLOVE) ×4
GOWN STRL REUS W/ TWL LRG LVL3 (GOWN DISPOSABLE) ×1 IMPLANT
GOWN STRL REUS W/ TWL XL LVL3 (GOWN DISPOSABLE) ×2 IMPLANT
GOWN STRL REUS W/TWL LRG LVL3 (GOWN DISPOSABLE) ×3
GOWN STRL REUS W/TWL XL LVL3 (GOWN DISPOSABLE) ×6
K-WIRE 2X5 SS THRDED S3 (WIRE) ×3
KIT BASIN OR (CUSTOM PROCEDURE TRAY) ×3 IMPLANT
KIT ROOM TURNOVER OR (KITS) ×6 IMPLANT
KWIRE 2X5 SS THRDED S3 (WIRE) IMPLANT
MANIFOLD NEPTUNE II (INSTRUMENTS) ×3 IMPLANT
NDL 1/2 CIR CATGUT .05X1.09 (NEEDLE) IMPLANT
NDL SUT .5 MAYO 1.404X.05X (NEEDLE) IMPLANT
NEEDLE 1/2 CIR CATGUT .05X1.09 (NEEDLE) ×3 IMPLANT
NEEDLE 22X1 1/2 (OR ONLY) (NEEDLE) ×3 IMPLANT
NEEDLE MAYO TAPER (NEEDLE)
NS IRRIG 1000ML POUR BTL (IV SOLUTION) ×3 IMPLANT
PACK SHOULDER (CUSTOM PROCEDURE TRAY) ×3 IMPLANT
PAD ARMBOARD 7.5X6 YLW CONV (MISCELLANEOUS) ×6 IMPLANT
PEG LOCKING 3.2X32 (Peg) ×4 IMPLANT
PEG LOCKING 3.2X34 (Screw) ×6 IMPLANT
PEG LOCKING 3.2X38 (Screw) ×2 IMPLANT
PEG LOCKING 3.2X48 (Peg) ×2 IMPLANT
PEG LOCKING 3.2X50 (Screw) ×2 IMPLANT
PLATE PROX HUM HI R 3H 80 (Plate) ×2 IMPLANT
RESTRAINT HEAD UNIVERSAL NS (MISCELLANEOUS) ×3 IMPLANT
SCREW T15 MD 3.5X24MM NS (Screw) ×2 IMPLANT
SCREW T15 MD 3.5X26MM NS (Screw) ×4 IMPLANT
SLEEVE MEASURING 3.2 (BIT) ×2 IMPLANT
SLING ARM IMMOBILIZER LRG (SOFTGOODS) ×2 IMPLANT
SLING ULTRA II LARGE (SOFTGOODS) ×3 IMPLANT
SPONGE LAP 18X18 X RAY DECT (DISPOSABLE) ×3 IMPLANT
SUCTION FRAZIER HANDLE 10FR (MISCELLANEOUS) ×2
SUCTION TUBE FRAZIER 10FR DISP (MISCELLANEOUS) ×1 IMPLANT
SUT FIBERWIRE #2 38 REV NDL BL (SUTURE) ×3
SUT FIBERWIRE #2 38 T-5 BLUE (SUTURE)
SUT MNCRL AB 3-0 PS2 18 (SUTURE) ×3 IMPLANT
SUT VIC AB 1 CT1 27 (SUTURE) ×3
SUT VIC AB 1 CT1 27XBRD ANTBC (SUTURE) ×1 IMPLANT
SUT VIC AB 2-0 CT1 27 (SUTURE) ×6
SUT VIC AB 2-0 CT1 TAPERPNT 27 (SUTURE) ×2 IMPLANT
SUTURE FIBERWR #2 38 T-5 BLUE (SUTURE) IMPLANT
SUTURE FIBERWR#2 38 REV NDL BL (SUTURE) IMPLANT
SYR CONTROL 10ML LL (SYRINGE) ×3 IMPLANT
TOWEL OR 17X24 6PK STRL BLUE (TOWEL DISPOSABLE) ×3 IMPLANT
TOWEL OR 17X26 10 PK STRL BLUE (TOWEL DISPOSABLE) ×3 IMPLANT
WATER STERILE IRR 1000ML POUR (IV SOLUTION) ×3 IMPLANT

## 2017-02-25 NOTE — Anesthesia Procedure Notes (Signed)
Procedure Name: Intubation Date/Time: 02/25/2017 4:04 PM Performed by: Manus Gunning, Icholas Irby J Pre-anesthesia Checklist: Patient identified, Emergency Drugs available, Suction available, Patient being monitored and Timeout performed Patient Re-evaluated:Patient Re-evaluated prior to inductionOxygen Delivery Method: Circle system utilized Preoxygenation: Pre-oxygenation with 100% oxygen Intubation Type: IV induction Ventilation: Mask ventilation without difficulty Laryngoscope Size: Mac and 3 Grade View: Grade I Tube type: Oral Tube size: 7.0 mm Number of attempts: 1 Placement Confirmation: ETT inserted through vocal cords under direct vision,  positive ETCO2 and breath sounds checked- equal and bilateral Secured at: 21 cm Tube secured with: Tape Dental Injury: Teeth and Oropharynx as per pre-operative assessment

## 2017-02-25 NOTE — Op Note (Deleted)
  The note originally documented on this encounter has been moved the the encounter in which it belongs.  

## 2017-02-25 NOTE — Op Note (Signed)
[name]:  Dowlen, Shaquia               ACCOUNT NO.:  [id number]  MEDICAL RECORD NO.:  [id number]  LOCATION:                               FACILITY:  [facility]  PHYSICIAN:  Vania ReaKevin M. Atleigh Gruen, M.D.  DATE OF BIRTH:  12-04-1971  DATE OF PROCEDURE:  02/25/2017 DATE OF DISCHARGE:                              OPERATIVE REPORT   PREOPERATIVE DIAGNOSIS:  Displaced right 3-part proximal humerus fracture.  POSTOPERATIVE DIAGNOSIS:  Displaced right 3-part proximal humerus fracture.  PROCEDURE:  Open reduction and internal fixation of displaced right 3- part proximal humerus fracture.  SURGEON:  Vania ReaKevin M. Anaia Frith, M.D.  Threasa HeadsASSISTANFrench Ana:  Tracy A. Shuford, .A-C  ANESTHESIA:  General endotracheal as well as an interscalene block.  ESTIMATED BLOOD LOSS:  300 mL.  DRAINS:  None.  HISTORY:  Ms. Tracie Robinson is a 46 year old female, who had a series of falls over the last week injuring her right shoulder with continued complaints of pain.  Ultimately sought care at a local emergency room where x-rays showed evidence of severely displaced 3-part proximal humerus fracture. Subsequently placed in a sling.  Followed up in our office yesterday where we re-evaluated her right upper extremity, which showed that she was grossly neurovascularly intact, but had diffuse swelling and tenderness about the shoulder, swelling into the forearm, wrist, and hand, but again grossly neurovascularly intact.  Radiographs again showed severe displacement of the humeral head with a 3-part fracture pattern.  She is brought to the operating room at this time for planned open reduction and internal fixation.  Preoperatively, I counseled Ms. Tracie HardingEllis regarding treatment options, potential risks versus benefits thereof.  Possible surgical complications were all reviewed including bleeding, infection, neurovascular injury, malunion, nonunion, loss of fixation, anesthetic complication, failure of fixation, and possible need for  additional surgery.  She understands and accepts and agrees to planned procedure.  PROCEDURE IN DETAIL:  After undergoing routine preop evaluation, the patient received prophylactic antibiotics.  An interscalene block was established in the holding area by the Anesthesia Department.  Placed supine on the operating table, underwent smooth induction of a general endotracheal anesthesia.  Placed in a beach chair position and appropriately padded and protected.  The right shoulder girdle region was sterilely prepped and draped in a standard fashion.  After we confirmed, fluoroscopically we visualized the shoulder properly.  Time- out was called.  An anterior deltopectoral approach to the right shoulder made through an 8-cm anterior incision.  Skin flaps were elevated.  Dissection carried deeply.  Deltopectoral interval was then developed, vein taken laterally.  At this point, we then identified the fracture site and there was significant early callus formation developing, so clearly this fracture was subacute.  Significant dissection and release of callus and adhesions were required to mobilize the fracture site and using fluoroscopic imaging we ultimately achieved what we felt to be an acceptable reduction with alignment of the shaft to the head on AP and axillary views.  We provisionally placed a 3-hole high Biomet proximal humeral plate and held this with a bone clamp, used fluoroscopic imaging to confirm.  We were happy with the overall alignment.  Placed a guidepin up into the center-center position of the  head and then fastened the plate to the shaft provisionally and then used a series of pegs to fasten the plate to the head and complete fixation of the plate to the shaft with the overall position of construct to our satisfaction.  In addition, there was a displaced posterior fragment of the greater tuberosity and this thin fragment we grasped using a series of #2 FiberWire grasping  sutures through the bone- tendon junction and these were then used to mobilize this fragment and obtain reduction and these suture limbs were then passed through the plate.  At the completion, we were pleased with the overall position and alignment.  Final irrigation was completed.  Hemostasis was obtained. The deltopectoral interval was then reapproximated with a series of figure-of-eight #1 Vicryl sutures.  2-0 Vicryl was used for the subcu layer, intracuticular 3-0 Monocryl for the skin followed by Dermabond, Aquacel dressing.  The right arm was placed in a sling.  The patient was awakened, extubated, taken to the recovery room in stable condition.  Ralene Bathe, PA-C, was used as an Geophysicist/field seismologist throughout this case, was essential for help with positioning the patient, positioning extremity, management of the tissue retractors, manipulation of the fracture, maintenance of reduction, wound closure, and intraoperative decision making.     Vania Rea. Ezella Kell, M.D.   ______________________________ Vania Rea. Elynn Patteson, M.D.    KMS/MEDQ  D:  02/25/2017  T:  02/25/2017  Job:  [id number]

## 2017-02-25 NOTE — Op Note (Signed)
02/25/2017  5:38 PM  PATIENT:   Tracie Robinson  46 y.o. female  PRE-OPERATIVE DIAGNOSIS:  right displaced 3 part  proximal humerus fracture  POST-OPERATIVE DIAGNOSIS:  same  PROCEDURE:  ORIF  SURGEON:  Dynisha Due, Vania ReaKevin M. M.D.  ASSISTANTS: Shuford pac   ANESTHESIA:   GET + ISB  EBL: 300  SPECIMEN:  none  Drains: none   PATIENT DISPOSITION:  PACU - hemodynamically stable.    PLAN OF CARE: Admit for overnight observation  Dictation# [id number]   Contact # [phone]

## 2017-02-25 NOTE — Discharge Instructions (Signed)
° °  Tracie ReaKevin M. Supple, M.D., F.A.A.O.S. Orthopaedic Surgery Specializing in Arthroscopic and Reconstructive Surgery of the Shoulder and Knee [phone] [address] [place], [place] [id number] - Fax [phone]   POST-OP  SHOULDER  INSTRUCTIONS  1. Call the office at [phone] to schedule your first post-op appointment 10-14 days from the date of your surgery.  2. The bandage over your incision is waterproof. You may begin showering with this dressing on. You may leave this dressing on until first follow up appointment within 2 weeks. We prefer you leave this dressing in place until follow up however after 5-7 days if you are having itching or skin irritation and would like to remove it you may do so. Go slow and tug at the borders gently to break the bond the dressing has with the skin. At this point if there is no drainage it is okay to go without a bandage or you may cover it with a light guaze and tape. You can also expect significant bruising around your shoulder that will drift down your arm and into your chest wall. This is very normal and should resolve over several days.   3. Wear your sling/immobilizer at all times except to perform the exercises below or to occasionally let your arm dangle by your side to stretch your elbow. You also need to sleep in your sling immobilizer until instructed otherwise.  4. Range of motion to your elbow, wrist, and hand are encouraged 3-5 times daily. Exercise to your hand and fingers helps to reduce swelling you may experience.  5. Utilize ice to the shoulder 3-5 times minimum a day and additionally if you are experiencing pain.  6. Prescriptions for a pain medication and a muscle relaxant are provided for you. It is recommended that if you are experiencing pain that you pain medication alone is not controlling, add the muscle relaxant along with the pain medication which can give additional pain relief. The first 1-2 days is generally  the most severe of your pain and then should gradually decrease. As your pain lessens it is recommended that you decrease your use of the pain medications to an "as needed basis'" only and to always comply with the recommended dosages of the pain medications.  7. Pain medications can produce constipation along with their use. If you experience this, the use of an over the counter stool softener or laxative daily is recommended.    8. For additional questions or concerns, please do not hesitate to call the office. If after hours there is an answering service to forward your concerns to the physician on call.  POST-OP EXERCISES  Move elbow wrist and hand for edema control

## 2017-02-25 NOTE — Anesthesia Postprocedure Evaluation (Signed)
Anesthesia Post Note  Patient: Ilda MoriCynthia P Mandile  Procedure(s) Performed: Procedure(s) (LRB): OPEN REDUCTION INTERNAL FIXATION (ORIF) PROXIMAL HUMERUS FRACTURE (Right)  Patient location during evaluation: PACU Anesthesia Type: General Level of consciousness: sedated Pain management: pain level controlled Vital Signs Assessment: post-procedure vital signs reviewed and stable Respiratory status: spontaneous breathing and respiratory function stable Cardiovascular status: stable Anesthetic complications: no       Last Vitals:  Vitals:   02/25/17 1926 02/25/17 1930  BP: 110/67   Pulse: 68 70  Resp: 18 19  Temp:      Last Pain:  Vitals:   02/25/17 1940  TempSrc:   PainSc: Asleep                 Harlem Bula DANIEL

## 2017-02-25 NOTE — H&P (Signed)
Tracie Robinson    Chief Complaint: right disIlda Moriplaced proximal humerus fracture HPI: The patient is a 46 y.o. female with a severely displaced right proximal humerus fracture  Past Medical History:  Diagnosis Date  . ADD (attention deficit disorder)   . Anxiety   . Arthritis    left ankle and leg  . Carpal tunnel syndrome of left wrist   . Chronic pain   . Constipation   . Depression   . Family history of adverse reaction to anesthesia    mother has n/v  . Headache     Past Surgical History:  Procedure Laterality Date  . ABDOMINAL HYSTERECTOMY     partial  . ANKLE SURGERY Left    numerous surgeries  . TUBAL LIGATION      Family History  Problem Relation Age of Onset  . CAD Mother   . Peripheral Artery Disease Mother   . Hypertension Father   . High Cholesterol Father     Social History:  reports that she has been smoking Cigarettes.  She has been smoking about 0.50 packs per day. She has never used smokeless tobacco. She reports that she drinks alcohol. She reports that she does not use drugs.   Medications Prior to Admission  Medication Sig Dispense Refill  . ADDERALL XR 20 MG 24 hr capsule Take 20 mg by mouth every morning.  0  . ALPRAZolam (XANAX) 1 MG tablet Take 1 mg by mouth 4 (four) times daily as needed for anxiety or sleep.     . Aspirin-Acetaminophen (GOODYS BODY PAIN PO) Take 1 Package by mouth every 4 (four) hours as needed (back pain).     Marland Kitchen. desvenlafaxine (PRISTIQ) 50 MG 24 hr tablet Take 50 mg by mouth every morning.  5  . docusate sodium (COLACE) 100 MG capsule Take 1 tablet once or twice daily as needed for constipation while taking narcotic pain medicine (Patient taking differently: Take 200 mg by mouth 2 (two) times daily. ) 30 capsule 0  . FLUoxetine (PROZAC) 40 MG capsule Take 40 mg by mouth daily.    Marland Kitchen. gabapentin (NEURONTIN) 300 MG capsule Take 1 capsule (300 mg total) by mouth 3 (three) times daily. 90 capsule 3  . HYDROcodone-acetaminophen  (NORCO/VICODIN) 5-325 MG tablet Take 1-2 tablets by mouth every 4 (four) hours as needed for moderate pain. 30 tablet 0  . tiZANidine (ZANAFLEX) 4 MG tablet Take 4 mg by mouth every 4 (four) hours as needed for muscle spasms.   5  . cephALEXin (KEFLEX) 500 MG capsule TAKE 1 CAPSULE (500 MG TOTAL) BY MOUTH 3 (THREE) TIMES DAILY. (Patient not taking: Reported on 02/23/2017) 30 capsule 1     Physical Exam: right shoulder with diffuse tenderness and swelling, exam as documented in the office yesterday.  Vitals  Temp:  [98.4 F (36.9 C)] 98.4 F (36.9 C) (03/08 1356) Pulse Rate:  [80-84] 84 (03/08 1430) Resp:  [12-20] 15 (03/08 1430) BP: (111-137)/(75-97) 111/77 (03/08 1430) SpO2:  [96 %-100 %] 100 % (03/08 1430) Weight:  [59 kg (130 lb)] 59 kg (130 lb) (03/08 1350)  Assessment/Plan  Impression: right displaced proximal humerus fracture  Plan of Action: Procedure(s): OPEN REDUCTION INTERNAL FIXATION (ORIF) PROXIMAL HUMERUS FRACTURE  Tracie Robinson M Tracie Robinson 02/25/2017, 3:10 PM Contact # [phone]

## 2017-02-25 NOTE — Anesthesia Preprocedure Evaluation (Signed)
Anesthesia Evaluation  Patient identified by MRN, date of birth, ID band Patient awake    Reviewed: Allergy & Precautions, NPO status , Patient's Chart, lab work & pertinent test results  Airway Mallampati: I       Dental   Pulmonary Current Smoker,    breath sounds clear to auscultation       Cardiovascular negative cardio ROS   Rhythm:Regular Rate:Normal     Neuro/Psych  Headaches, PSYCHIATRIC DISORDERS Anxiety Depression  Neuromuscular disease    GI/Hepatic negative GI ROS, Neg liver ROS,   Endo/Other  negative endocrine ROS  Renal/GU negative Renal ROS  negative genitourinary   Musculoskeletal  (+) Arthritis , Osteoarthritis,    Abdominal   Peds negative pediatric ROS (+)  Hematology negative hematology ROS (+)   Anesthesia Other Findings   Reproductive/Obstetrics negative OB ROS                             Anesthesia Physical Anesthesia Plan  ASA: II  Anesthesia Plan: General   Post-op Pain Management: GA combined w/ Regional for post-op pain   Induction: Intravenous  Airway Management Planned: Oral ETT  Additional Equipment:   Intra-op Plan:   Post-operative Plan: Extubation in OR  Informed Consent: I have reviewed the patients History and Physical, chart, labs and discussed the procedure including the risks, benefits and alternatives for the proposed anesthesia with the patient or authorized representative who has indicated his/her understanding and acceptance.   Dental advisory given  Plan Discussed with: CRNA  Anesthesia Plan Comments:         Anesthesia Quick Evaluation

## 2017-02-25 NOTE — Anesthesia Procedure Notes (Signed)
Anesthesia Regional Block: Interscalene brachial plexus block   Pre-Anesthetic Checklist: ,, timeout performed, Correct Patient, Correct Site, Correct Laterality, Correct Procedure, Correct Position, site marked, Risks and benefits discussed,  Surgical consent,  Pre-op evaluation,  At surgeon's request and post-op pain management  Laterality: Right  Prep: chloraprep       Needles:  Injection technique: Single-shot  Needle Type: Echogenic Needle     Needle Length: 9cm  Needle Gauge: 21     Additional Needles:   Procedures: ultrasound guided,,,,,,,,  Narrative:  Start time: 02/25/2017 2:20 PM End time: 02/25/2017 2:25 PM Injection made incrementally with aspirations every 5 mL.  Performed by: Personally  Anesthesiologist: Tracie Robinson, Tracie Robinson  Additional Notes: Tolerated well.

## 2017-02-25 NOTE — Transfer of Care (Signed)
Immediate Anesthesia Transfer of Care Note  Patient: Tracie MoriCynthia P Moren  Procedure(s) Performed: Procedure(s): OPEN REDUCTION INTERNAL FIXATION (ORIF) PROXIMAL HUMERUS FRACTURE (Right)  Patient Location: PACU  Anesthesia Type:GA combined with regional for post-op pain  Level of Consciousness: awake, oriented and patient cooperative  Airway & Oxygen Therapy: Patient Spontanous Breathing and Patient connected to nasal cannula oxygen  Post-op Assessment: Report given to RN, Post -op Vital signs reviewed and stable and Patient moving all extremities  Post vital signs: Reviewed and stable  Last Vitals:  Vitals:   02/25/17 1425 02/25/17 1430  BP: 131/86 111/77  Pulse: 81 84  Resp: 12 15  Temp:      Last Pain:  Vitals:   02/25/17 1356  TempSrc: Oral  PainSc:       Patients Stated Pain Goal: 4 (02/25/17 1345)  Complications: No apparent anesthesia complications

## 2017-02-26 ENCOUNTER — Encounter (HOSPITAL_COMMUNITY): Payer: Self-pay | Admitting: Orthopedic Surgery

## 2017-02-26 DIAGNOSIS — S42201A Unspecified fracture of upper end of right humerus, initial encounter for closed fracture: Secondary | ICD-10-CM | POA: Diagnosis not present

## 2017-02-26 MED ORDER — HYDROMORPHONE HCL 2 MG PO TABS: 2.0000 mg | ORAL_TABLET | ORAL | 0 refills | Status: AC | PRN

## 2017-02-26 MED ORDER — ONDANSETRON HCL 4 MG PO TABS: 4.0000 mg | ORAL_TABLET | Freq: Three times a day (TID) | ORAL | 0 refills | Status: AC | PRN

## 2017-02-26 NOTE — Evaluation (Signed)
Occupational Therapy Evaluation Patient Details Name: CASSIDY TASHIRO MRN: [id number] DOB: April 16, 1971 Today's Date: 02/26/2017    History of Present Illness (P)  S/P ORIF (open reduction internal fixation) fracture proximal humerus fx sustained during a fall. PMH includes, but not limited to, arthritis in L ankle and leg, numerous L ankle surgeries.   Clinical Impression   Pt admitted with the above diagnoses and presents with below problem list. Pt will benefit from continued acute OT to address the below listed deficits and maximize independence with basic ADLs prior to d/c home. PTA pt was independent with ADLs. Does endorse h/o falls. Pt is currently mostly min A for UB ADLs, close min guard for functional mobility. Pt anxious, somewhat impulsive and decreased safety awareness at times. Of note, pt opening personal bag in room and pulling out pill bottles. Verbalized plan to take another Xanax. Therapist told pt not to take any medications on her own while in the hospital and educated on risk of doing so. Nursing notified.       Follow Up Recommendations  No OT follow up;Supervision - Intermittent;Other (comment) (OOB/mobility)    Equipment Recommendations  None recommended by OT    Recommendations for Other Services PT consult     Precautions / Restrictions Precautions Precautions: Fall;Shoulder Type of Shoulder Precautions: passive protocol Shoulder Interventions: Shoulder sling/immobilizer;At all times;Off for dressing/bathing/exercises Precaution Booklet Issued: Yes (comment) Precaution Comments: h/o falls. pt reports L ankle "just gives out with no warning" Required Braces or Orthoses: Sling Restrictions Weight Bearing Restrictions: Yes RUE Weight Bearing: Non weight bearing      Mobility Bed Mobility Overal bed mobility: Needs Assistance Bed Mobility: Supine to Sit;Sit to Supine     Supine to sit: Min guard Sit to supine: Min guard      Transfers Overall  transfer level: Needs assistance Equipment used: None Transfers: Sit to/from [employer] Sit to Stand: Supervision Stand pivot transfers: Min guard       General transfer comment: no LOB but swaying noted on turns. impulsive at times especially when distracted.     Balance Overall balance assessment: History of Falls                                          ADL Overall ADL's : Needs assistance/impaired Eating/Feeding: Set up;Sitting   Grooming: Minimal assistance;Sitting   Upper Body Bathing: Minimal assistance;Sitting   Lower Body Bathing: Min guard   Upper Body Dressing : Minimal assistance;Sitting   Lower Body Dressing: Min guard   Toilet Transfer: Min guard   Toileting- Architect and Hygiene: Min guard;Set up;Sitting/lateral lean;Sit to/from stand   Tub/ Shower Transfer: Walk-in shower;Min guard   Functional mobility during ADLs: Min guard General ADL Comments: Pt completed bed mobility, in-room functional mobility, washed hands at sink, and toilet transfer. Pt educated on precautions, ADLs, e/w/h exercises. Pt giving mixed report of where she plans to d/c to her residence vs. Boyfriends. Whispered to therapist, "I plan to drive myself home tomorrow from his place." Therapist strongly advising pt not to drive tomorrow. Pt stating, "maybe I could get my dad to pick me up tomorrow."     Vision         Perception     Praxis      Pertinent Vitals/Pain Pain Assessment: 0-10 Pain Score: 10-Worst pain ever Pain Location: R shoulder Pain Descriptors /  Indicators: Aching;Sore;Burning Pain Intervention(s): Limited activity within patient's tolerance;Monitored during session;Premedicated before session;Repositioned;Ice applied     Hand Dominance Left   Extremity/Trunk Assessment Upper Extremity Assessment Upper Extremity Assessment: RUE deficits/detail RUE Deficits / Details: S/P ORIF (open reduction internal fixation)  fracture proximal humerus fx sustained during a fall. Pt wincing in pain when attempting elbow ROM in supine, pt declined further attempt. Educated on importance of AROM of elbow and advised to try again later and to time with pain meds. Educated on risk of not ranging R elbow.  RUE: Unable to fully assess due to pain;Unable to fully assess due to immobilization   Lower Extremity Assessment Lower Extremity Assessment: Defer to PT evaluation       Communication Communication Communication: No difficulties   Cognition Arousal/Alertness: Awake/alert Behavior During Therapy: Anxious;Agitated;Impulsive Overall Cognitive Status: Within Functional Limits for tasks assessed                     General Comments       Exercises Exercises: Other exercises Other Exercises Other Exercises: Educated on e/w/h ROM. Pt moaning in pain with initiation of R elbow ROM. Education on benefit of ROM R elbow and risk of not ranging it. Advised her to time with pain meds and try again later today.   Shoulder Instructions      Home Living Family/patient expects to be discharged to:: Private residence Living Arrangements: Spouse/significant other   Type of Home: House Home Access: Stairs to enter Secretary/administrator of Steps: 2 Entrance Stairs-Rails: Can reach both Home Layout: One level     Bathroom Shower/Tub: Runner, broadcasting/film/video: Shower seat          Prior Functioning/Environment Level of Independence: Independent        Comments: h/o falls        OT Problem List: Impaired balance (sitting and/or standing);Decreased knowledge of use of DME or AE;Decreased knowledge of precautions;Decreased safety awareness;Pain;Impaired UE functional use      OT Treatment/Interventions: Self-care/ADL training;DME and/or AE instruction;Therapeutic activities;Patient/family education;Balance training    OT Goals(Current goals can be found in the care plan section) Acute  Rehab OT Goals Patient Stated Goal: home today OT Goal Formulation: With patient Time For Goal Achievement: 03/05/17 Potential to Achieve Goals: Good ADL Goals Pt Will Perform Upper Body Bathing: sitting;with modified independence Pt Will Perform Upper Body Dressing: with modified independence;sitting Pt Will Perform Tub/Shower Transfer: with supervision;ambulating Pt/caregiver will Perform Home Exercise Program: Right Upper extremity;With written HEP provided (passive protocol; e/w/h ROM only)  OT Frequency: Min 3X/week   Barriers to D/C: Decreased caregiver support          Co-evaluation              End of Session Equipment Utilized During Treatment: Other (comment) (sling) Nurse Communication: Other (comment) (Pt has pill bottles in room.)  Activity Tolerance: Patient limited by pain Patient left: in bed;with call bell/phone within reach;with family/visitor present  OT Visit Diagnosis: Pain;Other abnormalities of gait and mobility (R26.89) Pain - Right/Left: Right Pain - part of body: Shoulder                ADL either performed or assessed with clinical judgement  Time: 0906-1000 OT Time Calculation (min): 54 min Charges:  OT General Charges $OT Visit: 1 Procedure OT Evaluation $OT Eval Moderate Complexity: 1 Procedure OT Treatments $Self Care/Home Management : 23-37 mins G-Codes: OT G-codes **NOT  FOR INPATIENT CLASS** Functional Assessment Tool Used: AM-PAC 6 Clicks Daily Activity Functional Limitation: Self care Self Care Current Status (Z6109(G8987): At least 20 percent but less than 40 percent impaired, limited or restricted Self Care Goal Status (U0454(G8988): At least 1 percent but less than 20 percent impaired, limited or restricted    Pilar GrammesMathews, Bernetta Sutley H 02/26/2017, 11:30 AM

## 2017-02-26 NOTE — Evaluation (Signed)
Physical Therapy Evaluation/Discharge Patient Details Name: Tracie Robinson MRN: [id number] DOB: 1971-04-05 Today's Date: 02/26/2017   History of Present Illness  (P)  S/P ORIF (open reduction internal fixation) fracture proximal humerus fx sustained during a fall. PMH includes, but not limited to, arthritis in L ankle and leg, numerous L ankle surgeries.  Clinical Impression  Pt is POD 1 following the above procedure. Prior to admission, pt lived alone and stayed some with her boyfriend. Pt plans to return home with her boyfriend to his single level home with 2 steps leading into back door. Pt reports that she fell about 5-6 times which resulted in humeral fracture. Pt is impulsive and unsafe with mobility which does put her at a higher risk for falls. Pt would benefit from continued PT services to work on balance and mobility, but pt is declining. Pt was initially refusing PT evaluation. Pt will not require any further acute PT services. Please re-order if situation changes.     Follow Up Recommendations No PT follow up    Equipment Recommendations  None recommended by PT    Recommendations for Other Services       Precautions / Restrictions Precautions Precautions: Fall;Shoulder Type of Shoulder Precautions: passive protocol Shoulder Interventions: Shoulder sling/immobilizer;At all times;Off for dressing/bathing/exercises Precaution Booklet Issued: Yes (comment) Precaution Comments: h/o falls. pt reports L ankle "just gives out with no warning" Required Braces or Orthoses: Sling Restrictions Weight Bearing Restrictions: Yes RUE Weight Bearing: Non weight bearing      Mobility  Bed Mobility Overal bed mobility: Needs Assistance Bed Mobility: Supine to Sit;Sit to Supine     Supine to sit: Min guard Sit to supine: Min guard   General bed mobility comments: Min guard for safety to EOB  Transfers Overall transfer level: Needs assistance Equipment used: None Transfers: Sit  to/from [employer] Sit to Stand: Supervision Stand pivot transfers: Min guard       General transfer comment: No LOB, but moves quickly and unsafely. Requires supervision for safety throughout mobility  Ambulation/Gait Ambulation/Gait assistance: Min guard Ambulation Distance (Feet): 30 Feet Assistive device: None Gait Pattern/deviations: Step-through pattern;Drifts right/left Gait velocity: decreased Gait velocity interpretation: Below normal speed for age/gender General Gait Details: Requires min guard for safety to impulsivity and unsafe quick movements. Pt is at a risk for falls with mobility but refuses Dalton use  Stairs            Wheelchair Mobility    Modified Rankin (Stroke Patients Only)       Balance Overall balance assessment: History of Falls                                           Pertinent Vitals/Pain Pain Assessment: 0-10 Pain Score: 10-Worst pain ever Pain Location: R shoulder Pain Descriptors / Indicators: Aching;Sore;Burning Pain Intervention(s): Limited activity within patient's tolerance;Monitored during session;Premedicated before session;Repositioned;Ice applied    Home Living Family/patient expects to be discharged to:: Private residence Living Arrangements: Spouse/significant other   Type of Home: House Home Access: Stairs to enter Entrance Stairs-Rails: Can reach both Entrance Stairs-Number of Steps: 2 Home Layout: One level Home Equipment: Shower seat      Prior Function Level of Independence: Independent         Comments: h/o falls     Hand Dominance   Dominant Hand: Left    Extremity/Trunk Assessment  Upper Extremity Assessment Upper Extremity Assessment: Defer to OT evaluation RUE Deficits / Details: S/P ORIF (open reduction internal fixation) fracture proximal humerus fx sustained during a fall. Pt wincing in pain when attempting elbow ROM in supine, pt declined further attempt.  Educated on importance of AROM of elbow and advised to try again later and to time with pain meds. Educated on risk of not ranging R elbow.  RUE: Unable to fully assess due to pain;Unable to fully assess due to immobilization    Lower Extremity Assessment Lower Extremity Assessment: Overall WFL for tasks assessed       Communication   Communication: No difficulties  Cognition Arousal/Alertness: Awake/alert Behavior During Therapy: Anxious;Agitated;Impulsive Overall Cognitive Status: Within Functional Limits for tasks assessed                 General Comments: becomes agitated about having a PT evaluation, but changes and becomes more agreeable.     General Comments      Exercises Other Exercises Other Exercises: Educated on e/w/h ROM. Pt moaning in pain with initiation of R elbow ROM. Education on benefit of ROM R elbow and risk of not ranging it. Advised her to time with pain meds and try again later today.   Assessment/Plan    PT Assessment All further PT needs can be met in the next venue of care  PT Problem List Decreased balance;Decreased mobility;Pain       PT Treatment Interventions      PT Goals (Current goals can be found in the Care Plan section)  Acute Rehab PT Goals Patient Stated Goal: home today PT Goal Formulation: With patient Time For Goal Achievement: 03/05/17 Potential to Achieve Goals: Good    Frequency     Barriers to discharge        Co-evaluation               End of Session Equipment Utilized During Treatment: Gait belt Activity Tolerance: Patient tolerated treatment well Patient left: in bed;with call bell/phone within reach;with family/visitor present Nurse Communication: Mobility status PT Visit Diagnosis: Pain Pain - Right/Left: Right Pain - part of body: Shoulder    Functional Assessment Tool Used: AM-PAC 6 Clicks Basic Mobility;Clinical judgement Functional Limitation: Mobility: Walking and moving around Mobility:  Walking and Moving Around Current Status (T5573): At least 20 percent but less than 40 percent impaired, limited or restricted Mobility: Walking and Moving Around Goal Status [phone]): 0 percent impaired, limited or restricted    Time: 1133-1155 PT Time Calculation (min) (ACUTE ONLY): 22 min   Charges:   PT Evaluation $PT Eval Moderate Complexity: 1 Procedure     PT G Codes:   PT G-Codes **NOT FOR INPATIENT CLASS** Functional Assessment Tool Used: AM-PAC 6 Clicks Basic Mobility;Clinical judgement Functional Limitation: Mobility: Walking and moving around Mobility: Walking and Moving Around Current Status (K2706): At least 20 percent but less than 40 percent impaired, limited or restricted Mobility: Walking and Moving Around Goal Status [phone]): 0 percent impaired, limited or restricted     Scheryl Marten PT, DPT  [phone]  02/26/2017, 12:05 PM

## 2017-02-26 NOTE — Discharge Summary (Signed)
PATIENT ID:      Tracie Robinson  MRN:     [id number] DOB/AGE:    [birth year] / 46 y.o.     DISCHARGE SUMMARY  ADMISSION DATE:    02/25/2017 DISCHARGE DATE:    ADMISSION DIAGNOSIS: right displaced proximal humerus fracture Past Medical History:  Diagnosis Date  . ADD (attention deficit disorder)   . Anxiety   . Arthritis    left ankle and leg  . Carpal tunnel syndrome of left wrist   . Chronic pain   . Constipation   . Depression   . Family history of adverse reaction to anesthesia    mother has n/v  . Headache     DISCHARGE DIAGNOSIS:   Principal Problem:   Closed 3-part fracture of proximal humerus, right, sequela Active Problems:   S/P ORIF (open reduction internal fixation) fracture   PROCEDURE: Procedure(s): OPEN REDUCTION INTERNAL FIXATION (ORIF) PROXIMAL HUMERUS FRACTURE on 02/25/2017  CONSULTS:    HISTORY:  See H&P in chart.  HOSPITAL COURSE:  CAMIYA Robinson is a 46 y.o. admitted on 02/25/2017 with a diagnosis of right displaced proximal humerus fracture.  They were brought to the operating room on 02/25/2017 and underwent Procedure(s): OPEN REDUCTION INTERNAL FIXATION (ORIF) PROXIMAL HUMERUS FRACTURE.    They were given perioperative antibiotics: Anti-infectives    Start     Dose/Rate Route Frequency Ordered Stop   02/26/17 0600  ceFAZolin (ANCEF) IVPB 2g/100 mL premix     2 g 200 mL/hr over 30 Minutes Intravenous On call to O.R. 02/25/17 1324 02/25/17 1556   02/25/17 2130  ceFAZolin (ANCEF) IVPB 2g/100 mL premix     2 g 200 mL/hr over 30 Minutes Intravenous Every 6 hours 02/25/17 2007 02/26/17 1529   02/25/17 1328  ceFAZolin (ANCEF) 2-4 GM/100ML-% IVPB    Comments:  Posey Pronto   : cabinet override      02/25/17 1328 02/25/17 1556    .  Patient underwent the above named procedure and tolerated it well. The following day they were hemodynamically stable and pain while severe according to patient was controlled on oral analgesics. Patient takes a number pf  pyschotropic meds making pain meds less effective. They were neurovascularly intact to the operative extremity. OT was ordered and worked with patient per protocol. They were medically and orthopaedically stable for discharge on day 1.     DIAGNOSTIC STUDIES:  RECENT RADIOGRAPHIC STUDIES :  Dg Shoulder Right  Result Date: 02/25/2017 CLINICAL DATA:  ORIF humerus fracture EXAM: DG C-ARM 61-120 MIN; RIGHT SHOULDER - 2+ VIEW COMPARISON:  02/21/2017 FINDINGS: Four low resolution spot intraoperative views of the right humerus are submitted. Total fluoroscopy time was 1 minutes 32 seconds. The initial images demonstrate a comminuted and displaced proximal humerus fracture. Subsequent images demonstrate surgical plate and multiple screw fixation of the proximal humerus fracture with improved alignment. IMPRESSION: Intraoperative fluoroscopic assistance provided during ORIF of right humerus fracture. Electronically Signed   By: Jasmine Pang M.D.   On: 02/25/2017 19:38   Dg Ankle Complete Left  Result Date: 02/21/2017 CLINICAL DATA:  Left ankle pain and weakness. EXAM: LEFT ANKLE COMPLETE - 3+ VIEW COMPARISON:  Radiographs 03/19/2016 FINDINGS: No acute fracture or subluxation. Two partially cannulated screws traverse the medial malleolus, hardware is intact. Two screws traverse the distal fibula, hardware is intact. Ghost tracks in the distal fibula from prior surgical hardware. Ankle mortise is preserved. No tibiotalar joint effusion. IMPRESSION: No acute abnormality.  Postsurgical change with intact  hardware. Electronically Signed   By: Rubye OaksMelanie  Ehinger M.D.   On: 02/21/2017 01:19   Dg Ankle Complete Right  Result Date: 02/21/2017 CLINICAL DATA:  Right ankle pain. EXAM: RIGHT ANKLE - COMPLETE 3+ VIEW COMPARISON:  Right foot radiographs 07/15/2016 FINDINGS: There is no evidence of fracture, dislocation, or joint effusion. There is no evidence of arthropathy or other focal bone abnormality. Soft tissues are  unremarkable. IMPRESSION: Negative radiographs of the right ankle. Electronically Signed   By: Rubye OaksMelanie  Ehinger M.D.   On: 02/21/2017 01:20   Dg Humerus Right  Result Date: 02/21/2017 CLINICAL DATA:  Right arm pain after fall. EXAM: RIGHT HUMERUS - 2+ VIEW COMPARISON:  None. FINDINGS: Comminuted fracture of the proximal humerus involves the surgical neck and lateral humeral head. There is medial displacement of the humeral shaft with respect to the head. Limited glenohumeral assessment. No additional fracture of the right humerus. Soft tissue edema is noted about the entire arm. IMPRESSION: Comminuted displaced fracture of the humeral head and neck. Electronically Signed   By: Rubye OaksMelanie  Ehinger M.D.   On: 02/21/2017 01:17   Dg Hand Complete Right  Result Date: 02/21/2017 CLINICAL DATA:  Right hand pain after fall. EXAM: RIGHT HAND - COMPLETE 3+ VIEW COMPARISON:  None. FINDINGS: There is no evidence of fracture or dislocation. There is no evidence of arthropathy or other focal bone abnormality. Diffuse soft tissue edema of the hand and wrist. IMPRESSION: No acute fracture or subluxation. Diffuse soft tissue edema of the hand and wrist. Electronically Signed   By: Rubye OaksMelanie  Ehinger M.D.   On: 02/21/2017 01:18   Dg C-arm 1-60 Min  Result Date: 02/25/2017 CLINICAL DATA:  ORIF humerus fracture EXAM: DG C-ARM 61-120 MIN; RIGHT SHOULDER - 2+ VIEW COMPARISON:  02/21/2017 FINDINGS: Four low resolution spot intraoperative views of the right humerus are submitted. Total fluoroscopy time was 1 minutes 32 seconds. The initial images demonstrate a comminuted and displaced proximal humerus fracture. Subsequent images demonstrate surgical plate and multiple screw fixation of the proximal humerus fracture with improved alignment. IMPRESSION: Intraoperative fluoroscopic assistance provided during ORIF of right humerus fracture. Electronically Signed   By: Jasmine PangKim  Fujinaga M.D.   On: 02/25/2017 19:38    RECENT VITAL SIGNS:   Patient Vitals for the past 24 hrs:  BP Temp Temp src Pulse Resp SpO2 Height Weight  02/26/17 0500 (!) 148/89 99.6 F (37.6 C) Oral (!) 104 - 98 % - -  02/25/17 2012 107/73 98 F (36.7 C) Oral 72 - 100 % - -  02/25/17 2004 112/69 97.9 F (36.6 C) Oral 82 19 97 % - -  02/25/17 1940 - 97.7 F (36.5 C) - - - - - -  02/25/17 1930 - - - 70 19 97 % - -  02/25/17 1926 110/67 - - 68 18 95 % - -  02/25/17 1915 - - - 70 (!) 26 93 % - -  02/25/17 1911 105/75 - - 75 14 (!) 87 % - -  02/25/17 1900 - - - 72 13 95 % - -  02/25/17 1856 116/75 - - 74 17 94 % - -  02/25/17 1845 - - - 96 16 (!) 81 % - -  02/25/17 1841 112/72 - - - 13 - - -  02/25/17 1830 - - - - 13 - - -  02/25/17 1826 112/78 - - - 13 - - -  02/25/17 1815 - - - - 14 - - -  02/25/17 1811 109/71 - - -  13 - - -  02/25/17 1810 - 97.7 F (36.5 C) - - - - - -  02/25/17 1430 111/77 - - 84 15 100 % - -  02/25/17 1425 131/86 - - 81 12 99 % - -  02/25/17 1420 (!) 127/97 - - 82 18 100 % - -  02/25/17 1415 137/88 - - 81 15 100 % - -  02/25/17 1410 126/75 - - 80 20 99 % - -  02/25/17 1356 126/77 98.4 F (36.9 C) Oral 80 16 96 % - -  02/25/17 1350 - - - - - - 5\' 5"  (1.651 m) 59 kg (130 lb)  .  RECENT EKG RESULTS:   No orders found for this or any previous visit.  DISCHARGE INSTRUCTIONS:    DISCHARGE MEDICATIONS:   Allergies as of 02/26/2017      Reactions   Vicodin [hydrocodone-acetaminophen] Itching   Bloating      Medication List    STOP taking these medications   HYDROcodone-acetaminophen 5-325 MG tablet Commonly known as:  NORCO/VICODIN     TAKE these medications   ADDERALL XR 20 MG 24 hr capsule Generic drug:  amphetamine-dextroamphetamine Take 20 mg by mouth every morning.   ALPRAZolam 1 MG tablet Commonly known as:  XANAX Take 1 mg by mouth 4 (four) times daily as needed for anxiety or sleep.   cephALEXin 500 MG capsule Commonly known as:  KEFLEX TAKE 1 CAPSULE (500 MG TOTAL) BY MOUTH 3 (THREE) TIMES DAILY.    desvenlafaxine 50 MG 24 hr tablet Commonly known as:  PRISTIQ Take 50 mg by mouth every morning.   docusate sodium 100 MG capsule Commonly known as:  COLACE Take 1 tablet once or twice daily as needed for constipation while taking narcotic pain medicine What changed:  how much to take  how to take this  when to take this  additional instructions   FLUoxetine 40 MG capsule Commonly known as:  PROZAC Take 40 mg by mouth daily.   gabapentin 300 MG capsule Commonly known as:  NEURONTIN Take 1 capsule (300 mg total) by mouth 3 (three) times daily.   GOODYS BODY PAIN PO Take 1 Package by mouth every 4 (four) hours as needed (back pain).   HYDROmorphone 2 MG tablet Commonly known as:  DILAUDID Take 1-2 tablets (2-4 mg total) by mouth every 4 (four) hours as needed for severe pain (please fill for severe pain, then transition to the percocet 10/325 rx. fill both).   ondansetron 4 MG tablet Commonly known as:  ZOFRAN Take 1 tablet (4 mg total) by mouth every 8 (eight) hours as needed for nausea or vomiting.   tiZANidine 4 MG tablet Commonly known as:  ZANAFLEX Take 4 mg by mouth every 4 (four) hours as needed for muscle spasms.       FOLLOW UP VISIT:   Follow-up Information    Vania Rea SUPPLE, MD.   Specialty:  Orthopedic Surgery Why:  call to be seen in 7-10 days Contact information: [address] [place] [address] [phone]           DISCHARGE TO: Home  DISPOSITION: Good  DISCHARGE CONDITION:  Rodolph Bong for Dr. Francena Hanly 02/26/2017, 8:31 AM

## 2017-03-08 ENCOUNTER — Ambulatory Visit: Payer: Self-pay | Admitting: Family

## 2017-05-24 NOTE — Anesthesia Postprocedure Evaluation (Signed)
Anesthesia Post Note  Patient: Ilda MoriCynthia P Therien  Procedure(s) Performed: Procedure(s) (LRB): OPEN REDUCTION INTERNAL FIXATION (ORIF) PROXIMAL HUMERUS FRACTURE (Right)     Anesthesia Post Evaluation  Last Vitals:  Vitals:   02/25/17 2012 02/26/17 0500  BP: 107/73 (!) 148/89  Pulse: 72 (!) 104  Resp:    Temp: 36.7 C 37.6 C    Last Pain:  Vitals:   02/26/17 0917  TempSrc:   PainSc: 4                  Jamarques Pinedo S

## 2017-05-24 NOTE — Addendum Note (Signed)
Addendum  created 05/24/17 1208 by Lebron Nauert, MD   Sign clinical note    

## 2017-11-20 IMAGING — RF DG C-ARM 61-120 MIN
1 series · 4 of 4 positions shown · non-contrast
Comparison: 02/21/2017

CLINICAL DATA: ORIF humerus fracture

EXAM:
DG C-ARM 61-120 MIN; RIGHT SHOULDER - 2+ VIEW

[Series 1: run · 4 of 4 slices shown]
[im 1/4]
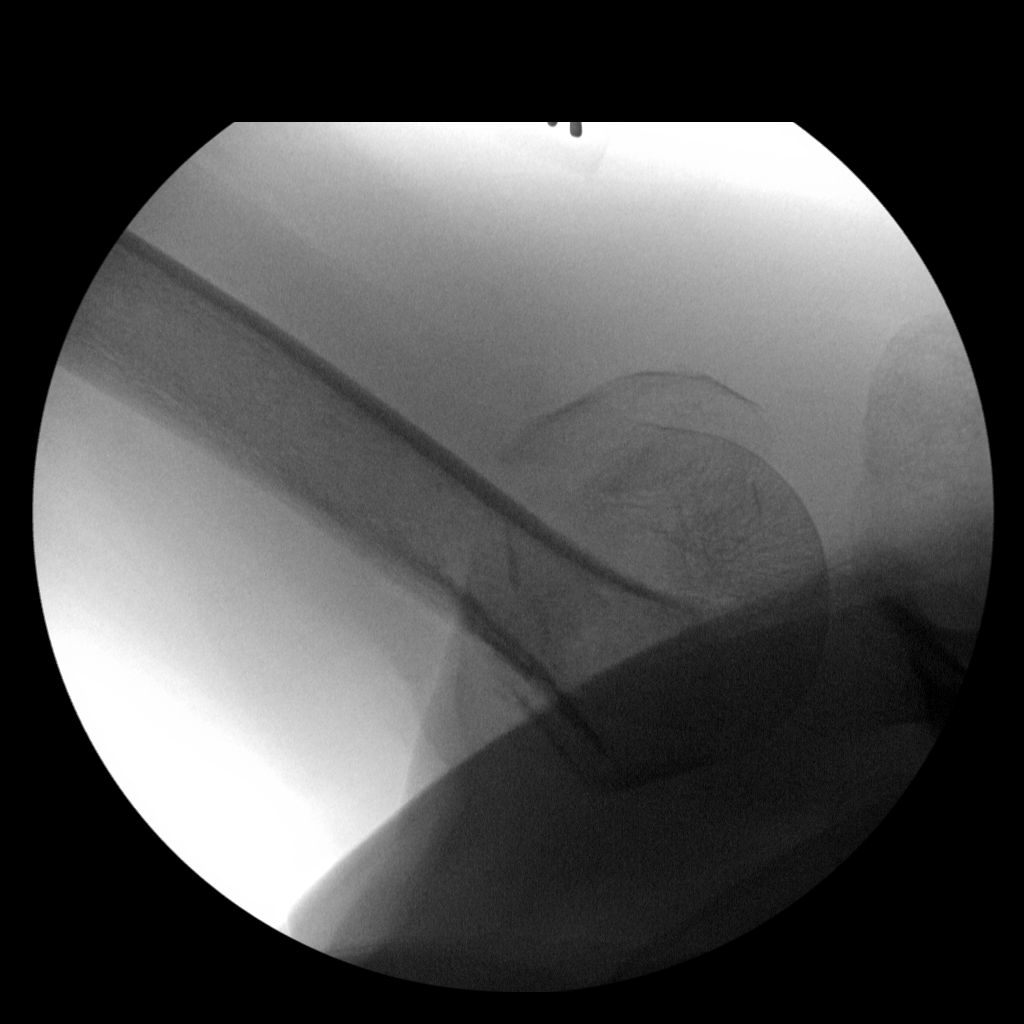
[im 2/4]
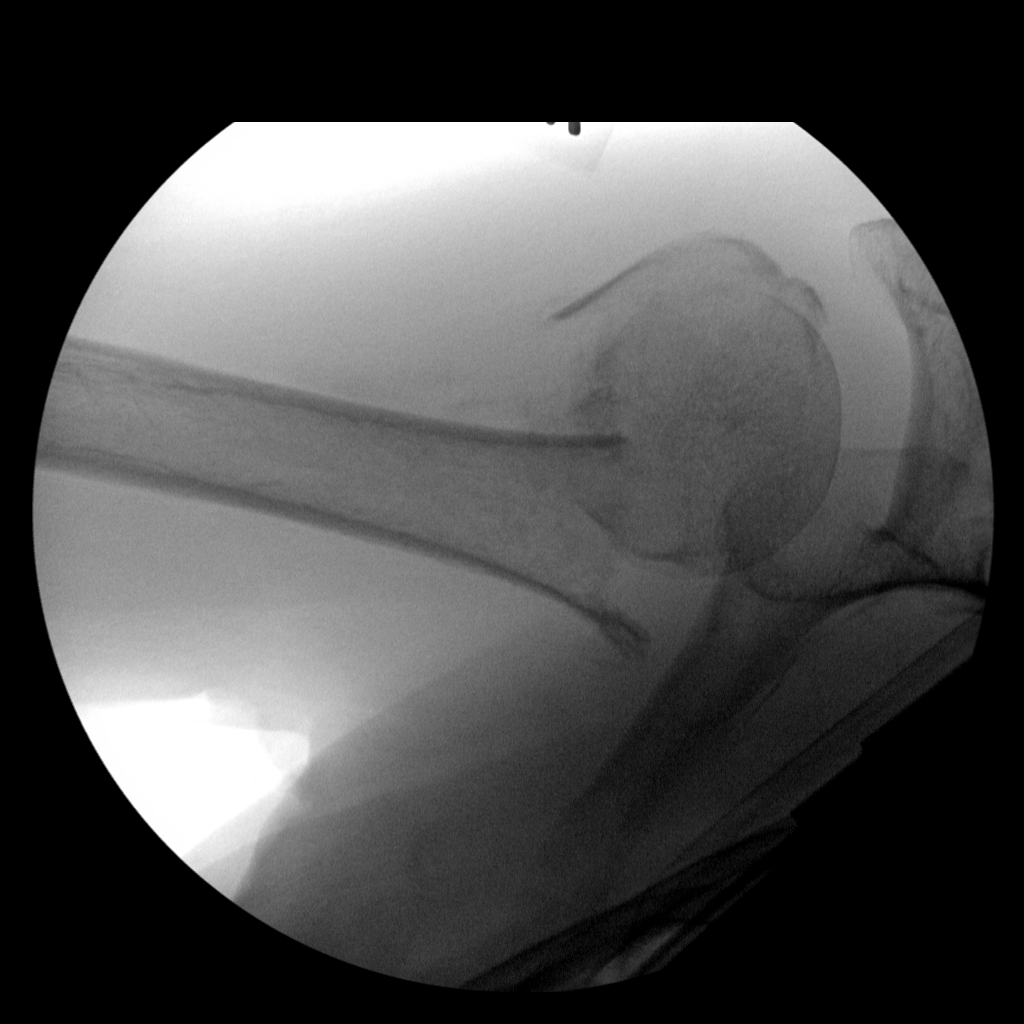
[im 3/4]
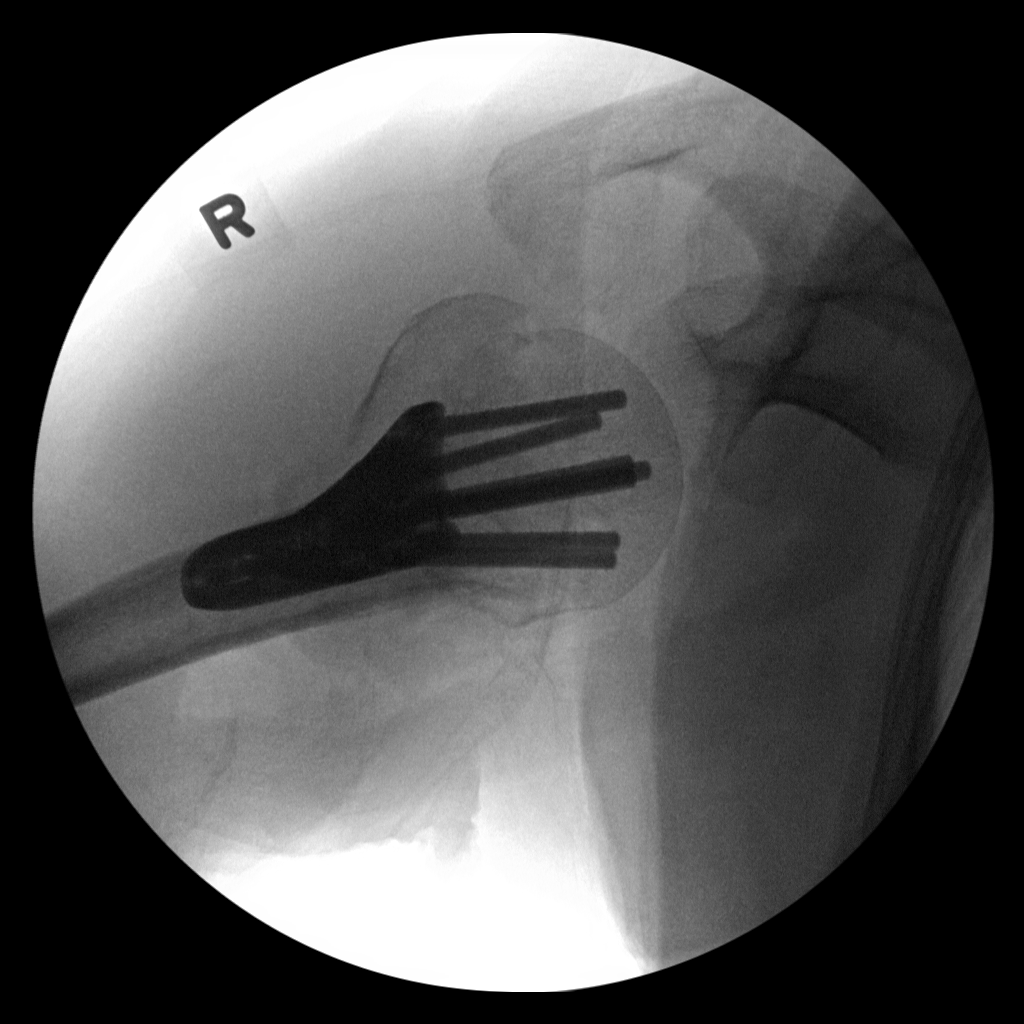
[im 4/4]
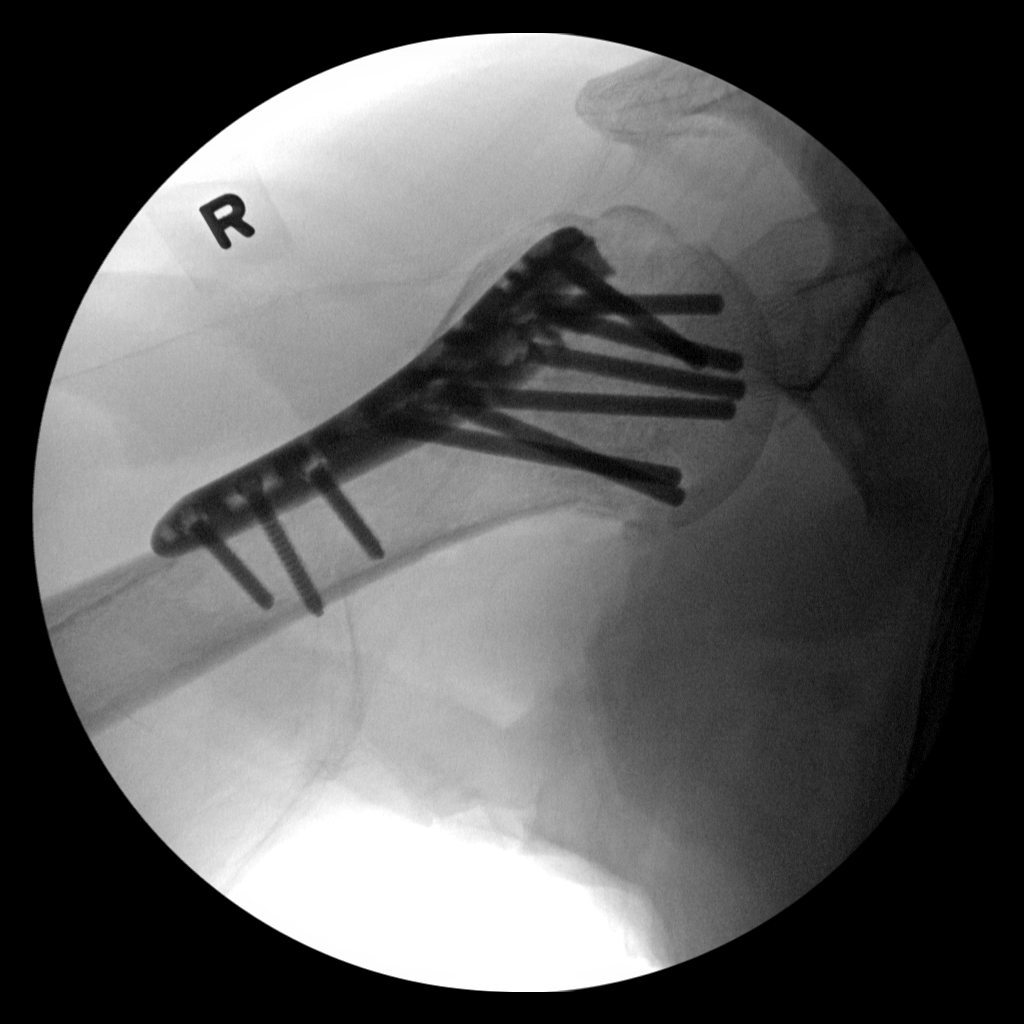

[4 of 4 positions shown; findings below may reference images not displayed]

FINDINGS: Four low resolution spot intraoperative views of the right humerus
are submitted. Total fluoroscopy time was 1 minutes 32 seconds.

The initial images demonstrate a comminuted and displaced proximal
humerus fracture. Subsequent images demonstrate surgical plate and
multiple screw fixation of the proximal humerus fracture with
improved alignment.
IMPRESSION: Intraoperative fluoroscopic assistance provided during ORIF of right
humerus fracture.
